# Patient Record
Sex: Female | Born: 1990 | Race: White | Hispanic: No | Marital: Single | Smoking: Former smoker
Health system: Southern US, Community
[De-identification: ages and names within clinical notes are randomized; demographics above are authoritative.]

## PROBLEM LIST (undated history)

## (undated) DIAGNOSIS — B078 Other viral warts: Secondary | ICD-10-CM

## (undated) DIAGNOSIS — K219 Gastro-esophageal reflux disease without esophagitis: Secondary | ICD-10-CM

## (undated) DIAGNOSIS — Z87898 Personal history of other specified conditions: Secondary | ICD-10-CM

## (undated) HISTORY — DX: Gastro-esophageal reflux disease without esophagitis: K21.9

## (undated) HISTORY — DX: Other viral warts: B07.8

## (undated) HISTORY — DX: Personal history of other specified conditions: Z87.898

## (undated) HISTORY — PX: FOOT SURGERY: SHX648

---
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
## 2010-09-17 HISTORY — PX: WISDOM TOOTH EXTRACTION: SHX21

## 2010-09-25 ENCOUNTER — Other Ambulatory Visit
Admission: RE | Admit: 2010-09-25 | Discharge: 2010-09-25 | Payer: Self-pay | Source: Home / Self Care | Admitting: Obstetrics and Gynecology

## 2012-05-20 ENCOUNTER — Encounter (HOSPITAL_BASED_OUTPATIENT_CLINIC_OR_DEPARTMENT_OTHER): Payer: Self-pay | Admitting: *Deleted

## 2012-05-20 ENCOUNTER — Emergency Department (HOSPITAL_BASED_OUTPATIENT_CLINIC_OR_DEPARTMENT_OTHER)
Admission: EM | Admit: 2012-05-20 | Discharge: 2012-05-20 | Disposition: A | Discharge status: Home / Self Care | Payer: BC Managed Care – PPO | Attending: Emergency Medicine | Admitting: Emergency Medicine

## 2012-05-20 DIAGNOSIS — S0990XA Unspecified injury of head, initial encounter: Secondary | ICD-10-CM | POA: Insufficient documentation

## 2012-05-20 DIAGNOSIS — S060X9A Concussion with loss of consciousness of unspecified duration, initial encounter: Secondary | ICD-10-CM

## 2012-05-20 DIAGNOSIS — IMO0002 Reserved for concepts with insufficient information to code with codable children: Secondary | ICD-10-CM

## 2012-05-20 DIAGNOSIS — Y9351 Activity, roller skating (inline) and skateboarding: Secondary | ICD-10-CM | POA: Insufficient documentation

## 2012-05-20 DIAGNOSIS — S139XXA Sprain of joints and ligaments of unspecified parts of neck, initial encounter: Secondary | ICD-10-CM | POA: Insufficient documentation

## 2012-05-20 DIAGNOSIS — S060XAA Concussion with loss of consciousness status unknown, initial encounter: Secondary | ICD-10-CM | POA: Insufficient documentation

## 2012-05-20 MED ORDER — ONDANSETRON HCL 8 MG PO TABS
4.0000 mg | ORAL_TABLET | Freq: Once | ORAL | Status: AC
  Administered 2012-05-20: 4 mg via ORAL
  Filled 2012-05-20: qty 1

## 2012-05-20 MED ORDER — HYDROCODONE-ACETAMINOPHEN 5-325 MG PO TABS: 1.0000 | ORAL_TABLET | ORAL | Status: AC | PRN

## 2012-05-20 MED ORDER — HYDROCODONE-ACETAMINOPHEN 5-325 MG PO TABS
2.0000 | ORAL_TABLET | Freq: Once | ORAL | Status: AC
  Administered 2012-05-20: 2 via ORAL
  Filled 2012-05-20: qty 2

## 2012-05-20 MED ORDER — ONDANSETRON HCL 4 MG PO TABS: 4.0000 mg | ORAL_TABLET | Freq: Four times a day (QID) | ORAL | Status: AC

## 2012-05-20 NOTE — ED Notes (Signed)
MD at bedside. 

## 2012-05-20 NOTE — ED Notes (Signed)
Pt was on a longboard 2 days ago and fell off hitting her head against asphalt. Denies LOC. Generalized pain. C/o lateral neck pains and upper back pains. + nausea but denies nausea. C/o "tunnel vision"

## 2012-05-21 NOTE — ED Provider Notes (Signed)
History     CSN: [id number]  Arrival date & time 05/20/12  Jane Nichols   First MD Initiated Contact with Patient 05/20/12 2214      Chief Complaint  Patient presents with  . Head Injury    (Consider location/radiation/quality/duration/timing/severity/associated sxs/prior treatment) HPI Jane Nichols is a 21 y.o. female who was riding a longboard skateboard two days ago and fell backward, first hitting her buttocks, then upper back, then occiput on asphalt. Denies LOC, immediate vomiting.  PT was seen by PCP and referred to ER.  PCP obtained cervical XR.  PT comlpains of bilateral cervical paraspinous muscle soreness and trapezius muscle soreness.  Pain is moderate, well localized, worse when moving her neck or shoulder girdle, she has not taken any pain medicine today, received some relief yesterday with pain relievers.  Pt is also having a dull headache for last two days which is intermittent gradual, now mild, not associated with photophobia, no vomiting, paresthesias, weakness.  She also describes difficulty concentrating, lack of focus/attention, irritability.   History reviewed. No pertinent past medical history.  Past Surgical History  Procedure Date  . Foot surgery     No family history on file.  History  Substance Use Topics  . Smoking status: Never Smoker   . Smokeless tobacco: Not on file  . Alcohol Use: No    OB History    Grav Para Term Preterm Abortions TAB SAB Ect Mult Living                  Review of Systems Positive for headache, poor concentration, nausea, neck pain, sleep disturbance, transient tunnel vision; Patient denies any fevers or chills, earache, sore throat, chest pain or pressure, palpitations, syncope, dyspnea, cough, wheezing,  abdominal pain, vomiting, diarrhea, melena, red bloody stools, frequency, dysuria, arthralgias, rash, itching, skin lesions, easy bruising or bleeding, seizures, numbness, tingling or weakness.    Allergies  Review of  patient's allergies indicates no known allergies.  Home Medications   Current Outpatient Rx  Name Route Sig Dispense Refill  . GOODY HEADACHE PO Oral Take 1 packet by mouth once as needed. For headache    . HYDROCODONE-ACETAMINOPHEN 5-325 MG PO TABS Oral Take 1-2 tablets by mouth every 4 (four) hours as needed for pain. 10 tablet 0  . ONDANSETRON HCL 4 MG PO TABS Oral Take 1 tablet (4 mg total) by mouth every 6 (six) hours. 12 tablet 0    BP 124/81  Pulse 91  Temp 98.6 F (37 C) (Oral)  Resp 20  Ht 5\' 3"  (1.6 m)  Wt 150 lb (68.04 kg)  BMI 26.57 kg/m2  SpO2 100%  LMP 05/20/2012  Physical Exam PHYSICAL EXAM: VITAL SIGNS:   Filed Vitals:   05/20/12 2154  BP: 124/81  Pulse: 91  Temp:   Resp: 20   CONSTITUTIONAL: Awake, oriented, appears non-toxic HENT: Atraumatic, normocephalic, oral mucosa pink and moist, airway patent. Nares patent without drainage. External ears normal. EYES: Conjunctiva clear, EOMI, PERRLA NECK: Trachea midline, non-tender, supple CARDIOVASCULAR: Normal heart rate, Normal rhythm, No murmurs, rubs, gallops PULMONARY/CHEST: Clear to auscultation, no rhonchi, wheezes, or rales. Symmetrical breath sounds. CHEST WALL: No lesions. Non-tender. ABDOMINAL: Non-distended, soft, non-tender - no rebound or guarding.  BS normal. NEUROLOGIC: WU:[id number] fields intact. PERRLA, EOMI.  Facial sensation equal to light touch bilaterally.  Good muscle bulk in the masseter muscle and good lateral movement of the jaw.  Facial expressions equal and good strength with smile/frown and puffed cheeks.  Hearing grossly intact to finger rub test.  Uvula, tongue are midline with no deviation. Symmetrical palate elevation.  Trapezius and SCM muscles are 5/5 strength bilaterally.   DTR: Brachioradialis, biceps, patellar, Achilles tendon reflexes 2+ bilaterally.  No clonus. Strength: 5/5 strength flexors and extensors in the upper and lower extremities.  Grip strength, finger  adduction/abduction 5/5. Sensation: Sensation intact distally to light touch Cerebellar: No ataxia with walking or dysmetria with finger to nose, rapid alternating hand movements and heels to shin testing. Gait and Station: Normal heel/toe, and tandem gait.  Negative Romberg, no pronator drift EXTREMITIES: No clubbing, cyanosis, or edema SKIN: Warm, Dry, No erythema, No rash  ED Course  Procedures (including critical care time)  Labs Reviewed - No data to display No results found.   1. Concussion   2. Head injury   3. Acute sprain or strain of cervical region     MDM  Jane Nichols is a 21 y.o. female presents 2 days after head injury with concussion.  She has no focal neurologic deficits.  Headaches and history not c/w hemorrhage, no prior medical history of brain lesions.  Neck is clinically clear.  Pt fell hitting two parts of her body before her head and presents 2 days after injury without focal deficit but symptoms consistent with concussion.  I discussed risks and benefits of CT of head - and told patient and mother there is a very small chance of missing serious pathology with her current presentation and the radiation risk was likely not worth any diagnostic data as stated.  Transient vision changes are consistent with attention lapses, no vision problems reported on exam.  No suggestion of vision loss that is persistent c/w vessel damage, retinal damage or visual cortex damage.  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  Also, she is currently taking 5 classes, urged to space study periods, keep good sleep habits and monitor her headaches, moods and cognition carefully.  She and mom understand to return to the ER for any concerning changes.  Pt educated on length of symptoms may be days to months.    I explained the diagnosis and have given explicit precautions to return to the ER including change in  behavior, change in vision, worsening headache, vomiting or any other new or worsening symptoms. The patient understands and accepts the medical plan as it's been dictated and I have answered their questions. Discharge instructions concerning home care and prescriptions have been given.  The patient is STABLE and is discharged to home in good condition.          Jones Skene, MD 05/21/12 1324

## 2013-04-23 ENCOUNTER — Other Ambulatory Visit: Payer: Self-pay | Admitting: Nurse Practitioner

## 2013-04-23 ENCOUNTER — Other Ambulatory Visit (HOSPITAL_COMMUNITY)
Admission: RE | Admit: 2013-04-23 | Discharge: 2013-04-23 | Disposition: A | Discharge status: Home / Self Care | Payer: BC Managed Care – PPO | Source: Ambulatory Visit | Attending: Obstetrics and Gynecology | Admitting: Obstetrics and Gynecology

## 2013-04-23 DIAGNOSIS — Z01419 Encounter for gynecological examination (general) (routine) without abnormal findings: Secondary | ICD-10-CM | POA: Insufficient documentation

## 2013-04-23 DIAGNOSIS — Z113 Encounter for screening for infections with a predominantly sexual mode of transmission: Secondary | ICD-10-CM | POA: Insufficient documentation

## 2019-08-10 ENCOUNTER — Other Ambulatory Visit: Payer: Self-pay

## 2019-08-10 ENCOUNTER — Ambulatory Visit (INDEPENDENT_AMBULATORY_CARE_PROVIDER_SITE_OTHER): Admitting: Family Medicine

## 2019-08-10 ENCOUNTER — Encounter: Payer: Self-pay | Admitting: Family Medicine

## 2019-08-10 VITALS — BP 136/82 | HR 97 | Temp 98.3°F | Resp 14 | Ht 64.0 in | Wt 147.1 lb

## 2019-08-10 DIAGNOSIS — Z0000 Encounter for general adult medical examination without abnormal findings: Secondary | ICD-10-CM

## 2019-08-10 DIAGNOSIS — L989 Disorder of the skin and subcutaneous tissue, unspecified: Secondary | ICD-10-CM

## 2019-08-10 DIAGNOSIS — Z7689 Persons encountering health services in other specified circumstances: Secondary | ICD-10-CM | POA: Diagnosis not present

## 2019-08-10 DIAGNOSIS — Z0100 Encounter for examination of eyes and vision without abnormal findings: Secondary | ICD-10-CM

## 2019-08-10 DIAGNOSIS — Z021 Encounter for pre-employment examination: Secondary | ICD-10-CM | POA: Diagnosis not present

## 2019-08-10 DIAGNOSIS — Z1329 Encounter for screening for other suspected endocrine disorder: Secondary | ICD-10-CM | POA: Diagnosis not present

## 2019-08-10 DIAGNOSIS — Z0110 Encounter for examination of ears and hearing without abnormal findings: Secondary | ICD-10-CM

## 2019-08-10 DIAGNOSIS — Z13220 Encounter for screening for lipoid disorders: Secondary | ICD-10-CM

## 2019-08-10 DIAGNOSIS — Z111 Encounter for screening for respiratory tuberculosis: Secondary | ICD-10-CM

## 2019-08-10 DIAGNOSIS — Z23 Encounter for immunization: Secondary | ICD-10-CM | POA: Diagnosis not present

## 2019-08-10 DIAGNOSIS — Z130 Encounter for screening for diseases of the blood and blood-forming organs and certain disorders involving the immune mechanism: Secondary | ICD-10-CM

## 2019-08-10 DIAGNOSIS — Z13228 Encounter for screening for other metabolic disorders: Secondary | ICD-10-CM

## 2019-08-10 LAB — POCT URINALYSIS DIPSTICK
Bilirubin, UA: NEGATIVE
Blood, UA: NEGATIVE
Glucose, UA: NEGATIVE
Ketones, UA: NEGATIVE
Leukocytes, UA: NEGATIVE
Nitrite, UA: NEGATIVE
Odor: NORMAL
Protein, UA: NEGATIVE
Spec Grav, UA: 1.02 (ref 1.010–1.025)
Urobilinogen, UA: 0.2 E.U./dL
pH, UA: 5.5 (ref 5.0–8.0)

## 2019-08-10 NOTE — Progress Notes (Signed)
Patient: Jane Nichols, Female    DOB: 02-19-1991, 28 y.o.   MRN: [id number] Delsa Grana, PA-C Visit Date: 08/12/2019  Today's Provider: Delsa Grana, PA-C   Chief Complaint  Patient presents with  . Establish Care  . Referrals    [facility], derm: warts and skin check  . Form Completion    basic law enforcement training   Subjective:   Annual physical exam:  Jane Nichols is a 28 y.o. 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    Hearing and vision screening done today  Diet/nutrition: Generally really healthy diet  Sleep: Sleeps well  Patient does need multiple forms filled out for a educational program with local sheriff's department and [facility] state basically preemployment [place] education clearance for physical testing also includes vision, hearing, transmittable diseases, cardiovascular exam muscle skeletal exam etc.   Advised pt of separate visit billing/coding  USPSTF grade A and B recommendations - reviewed and addressed today  Depression:  Phq 9 completed today by patient, was reviewed by me with patient in the room, score is  negative, pt feels good PHQ 2/9 Scores 08/10/2019  PHQ - 2 Score 0  PHQ- 9 Score 0   Depression screen PHQ 2/9 08/10/2019  Decreased Interest 0  Down, Depressed, Hopeless 0  PHQ - 2 Score 0  Altered sleeping 0  Tired, decreased energy 0  Change in appetite 0  Feeling bad or failure about yourself  0  Trouble concentrating 0   Moving slowly or fidgety/restless 0  Suicidal thoughts 0  PHQ-9 Score 0  Difficult doing work/chores Not difficult at all    Alcohol screening:   Office Visit from 08/10/2019 in [facility]  AUDIT-C Score  2      Immunizations and Health Maintenance: Health Maintenance  Topic Date Due  . PAP-Cervical Cytology Screening  07/08/2020  . PAP SMEAR-Modifier  08/08/2020  . TETANUS/TDAP  08/09/2029  . INFLUENZA VACCINE  Completed  . HIV Screening  Completed     Hep C Screening: not indicated  STD testing and prevention (HIV/chl/gon/syphilis): HIV done in the past, last PAP and GYN exam 2018  Intimate partner violence:feels safe at home and in her relationship  Sexual History/Pain during Intercourse:  neg Significant Other  Menstrual History/LMP/Abnormal Bleeding: normal menses Patient's last menstrual period was 07/10/2019.  Incontinence Symptoms: none  Breast cancer:  Last Mammogram: 2018 - benign BRCA gene screening: none  Cervical cancer screening: 2018 Family hx of cancers - breast, ovarian, uterine, colon:  none  Osteoporosis:   Discussed high calcium and vitamin D supplementation, weight bearing exercises  Skin cancer:  Hx of skin CA -  NO Discussed atypical lesions - she does have some skin lesions to right hand - appear similar to warts - she would like referral to dermatology  Colorectal cancer:   colonoscopy is not indicated per age    Lung cancer:   Low Dose CT Chest recommended if Age 1-80  years, 30 pack-year currently smoking OR have quit w/in 15years. Patient does not qualify.   Social History   Tobacco Use  . Smoking status: Former Smoker    Packs/day: 0.50    Years: 4.00    Pack years: 2.00  . Smokeless tobacco: Never Used  Substance Use Topics  . Alcohol use: Yes    Alcohol/week: 1.0 standard drinks    Types: 1 Cans of beer per week     ECG: not indicated  Blood pressure/Hypertension: BP Readings from Last 3  Encounters:  08/10/19 136/82  05/20/12 124/81    Weight/Obesity: Wt Readings from Last 3 Encounters:  08/10/19 147 lb 1.6 oz (66.7 kg)  05/20/12 150 lb (68 kg)   BMI Readings from Last 3 Encounters:  08/10/19 25.25 kg/m  05/20/12 26.57 kg/m     Lipids:  No results found for: CHOL No results found for: HDL No results found for: LDLCALC No results found for: TRIG No results found for: CHOLHDL No results found for: LDLDIRECT Based on the results of lipid panel his/her cardiovascular risk factor ( using [place] )  in the next 10 years is: The ASCVD Risk score Mikey Bussing DC Jr., et al., 2013) failed to calculate for the following reasons:   The 2013 ASCVD risk score is only valid for ages 49 to 31  Glucose:  No results found for: GLUCOSE, [place]    Office Visit from 08/10/2019 in [facility]  AUDIT-C Score  2     Depression: Phq 9 is  negative Depression screen [facility] 2/9 08/10/2019  Decreased Interest 0  Down, Depressed, Hopeless 0  PHQ - 2 Score 0  Altered sleeping 0  Tired, decreased energy 0  Change in appetite 0  Feeling bad or failure about yourself  0  Trouble concentrating 0  Moving slowly or fidgety/restless 0  Suicidal thoughts 0  PHQ-9 Score 0  Difficult doing work/chores Not difficult at all   Hypertension: BP Readings from Last 3 Encounters:  08/10/19 136/82  05/20/12 124/81   Obesity: Wt Readings from Last 3 Encounters:  08/10/19 147 lb 1.6 oz (66.7 kg)  05/20/12 150 lb (68 kg)   BMI Readings from Last 3 Encounters:  08/10/19 25.25 kg/m  05/20/12 26.57 kg/m     Social History      She  reports that she has quit smoking. She has a 2.00 pack-year smoking history. She has never used smokeless tobacco. She reports current alcohol use of about 1.0 standard drinks of alcohol per week. She reports that she does not use drugs.       Social History   Socioeconomic History  . Marital status: Significant Other    Spouse name:  Not on file  . Number of children: Not on file  . Years of education: 15  . Highest education level: Bachelor's degree (e.g., BA, AB, BS)  Occupational History  . Not on file  Social Needs  . Financial resource strain: Not hard at all  . Food insecurity    Worry: Never true    Inability: Never true  . Transportation needs    Medical: No    Non-medical: No  Tobacco Use  . Smoking status: Former Smoker    Packs/day: 0.50    Years: 4.00    Pack years: 2.00  . Smokeless tobacco: Never Used  Substance and Sexual Activity  . Alcohol use: Yes    Alcohol/week: 1.0 standard drinks    Types: 1 Cans of  beer per week  . Drug use: No  . Sexual activity: Yes    Birth control/protection: None    Comment: female partner  Lifestyle  . Physical activity    Days per week: 4 days    Minutes per session: 60 min  . Stress: To some extent  Relationships  . Social Herbalist on phone: Twice a week    Gets together: Twice a week    Attends religious service: More than 4 times per year    Active member of club or organization: No    Attends meetings of clubs or organizations: Never    Relationship status: Not on file  Other Topics Concern  . Not on file  Social History Narrative  . Not on file    Family History        Family Status  Relation Name Status  . Mother  Alive  . Father  Alive  . Sister 1 Alive  . Brother 1 Alive  . MGM  Alive  . MGF  Alive  . PGM  Alive  . PGF  Alive        Her family history includes Bipolar disorder in her mother; Diabetes in her paternal grandfather.       Family History  Problem Relation Age of Onset  . Bipolar disorder Mother   . Diabetes Paternal Grandfather     There are no active problems to display for this patient.   Past Surgical History:  Procedure Laterality Date  . FOOT SURGERY    . TOE SURGERY Right 2006   bone spur  . WISDOM TOOTH EXTRACTION  2012     Current Outpatient Medications:  .   Aspirin-Acetaminophen-Caffeine (GOODY HEADACHE PO), Take 1 packet by mouth once as needed. For headache, Disp: , Rfl:   No Known Allergies  Patient Care Team: Delsa Grana, PA-C as PCP - General (Family Medicine)  Review of Systems  Constitutional: Negative.  Negative for activity change, appetite change, fatigue and unexpected weight change.  HENT: Negative.   Eyes: Negative.   Respiratory: Negative.  Negative for shortness of breath.   Cardiovascular: Negative.  Negative for chest pain, palpitations and leg swelling.  Gastrointestinal: Negative.  Negative for abdominal pain and blood in stool.  Endocrine: Negative.   Genitourinary: Negative.   Musculoskeletal: Negative.  Negative for arthralgias, gait problem, joint swelling and myalgias.  Skin: Negative.  Negative for color change, pallor and rash.  Allergic/Immunologic: Negative.   Neurological: Negative.  Negative for syncope and weakness.  Hematological: Negative.   Psychiatric/Behavioral: Negative.  Negative for confusion, dysphoric mood, self-injury and suicidal ideas. The patient is not nervous/anxious.             Objective:   Vitals:  Vitals:   08/10/19 1455  BP: 136/82  Pulse: 97  Resp: 14  Temp: 98.3 F (36.8 C)  SpO2: 100%  Weight: 147 lb 1.6 oz (66.7 kg)  Height: 5' 4"  (1.626 m)    Body mass index is 25.25 kg/m.  Physical Exam Vitals signs and nursing note reviewed.  Constitutional:      General: She is not in acute distress.    Appearance: Normal appearance. She is well-developed and normal weight. She is not ill-appearing, toxic-appearing or diaphoretic.  HENT:     Head: Normocephalic and atraumatic.     Right Ear: Tympanic membrane, ear canal and external ear normal. There is no impacted cerumen.     Left Ear: Tympanic membrane,  ear canal and external ear normal. There is no impacted cerumen.     Nose: Nose normal. No congestion or rhinorrhea.     Mouth/Throat:     Mouth: Mucous membranes are  moist.     Pharynx: Oropharynx is clear. Uvula midline.  Eyes:     General: Lids are normal. No scleral icterus.       Right eye: No discharge.        Left eye: No discharge.     Conjunctiva/sclera: Conjunctivae normal.     Pupils: Pupils are equal, round, and reactive to light.  Neck:     Musculoskeletal: Normal range of motion and neck supple.     Trachea: Phonation normal. No tracheal deviation.  Cardiovascular:     Rate and Rhythm: Normal rate and regular rhythm.     Pulses: Normal pulses.          Radial pulses are 2+ on the right side and 2+ on the left side.       Posterior tibial pulses are 2+ on the right side and 2+ on the left side.     Heart sounds: Normal heart sounds. No murmur. No friction rub. No gallop.   Pulmonary:     Effort: Pulmonary effort is normal. No respiratory distress.     Breath sounds: Normal breath sounds. No stridor. No wheezing, rhonchi or rales.  Chest:     Chest wall: No tenderness.  Abdominal:     General: Bowel sounds are normal. There is no distension.     Palpations: Abdomen is soft.     Tenderness: There is no abdominal tenderness. There is no guarding or rebound.  Musculoskeletal: Normal range of motion.        General: No deformity.  Lymphadenopathy:     Cervical: No cervical adenopathy.  Skin:    General: Skin is warm and dry.     Capillary Refill: Capillary refill takes less than 2 seconds.     Coloration: Skin is not jaundiced or pale.     Findings: Lesion present. No bruising or rash.     Comments: Raised flesh-colored papules to right finger roughly 7 mm in diameter  Neurological:     Mental Status: She is alert and oriented to person, place, and time.     Motor: No weakness or abnormal muscle tone.     Coordination: Coordination normal.     Gait: Gait normal.  Psychiatric:        Mood and Affect: Mood normal.        Speech: Speech normal.        Behavior: Behavior normal.    Vision and hearing screening normal, reviewed,  see EMR   Fall Risk: Fall Risk  08/10/2019  Falls in the past year? 0  Number falls in past yr: 0  Injury with Fall? 0    Functional Status Survey: Is the patient deaf or have difficulty hearing?: No Does the patient have difficulty seeing, even when wearing glasses/contacts?: No Does the patient have difficulty concentrating, remembering, or making decisions?: No Does the patient have difficulty walking or climbing stairs?: No Does the patient have difficulty dressing or bathing?: No Does the patient have difficulty doing errands alone such as visiting a doctor's office or shopping?: No   Assessment & Plan:    CPE completed today  . USPSTF grade A and B recommendations reviewed with patient; age-appropriate recommendations, preventive care, screening tests, etc discussed and encouraged; healthy living encouraged; see AVS  for patient education given to patient  . Discussed importance of 150 minutes of physical activity weekly, AHA exercise recommendations given to pt in AVS/handout  . Discussed importance of healthy diet:  eating lean meats and proteins, avoiding trans fats and saturated fats, avoid simple sugars and excessive carbs in diet, eat 6 servings of fruit/vegetables daily and drink plenty of water and avoid sweet beverages.    . Recommended pt to do annual eye exam and routine dental exams/cleanings  . Depression, alcohol, fall screening completed as documented above and per flowsheets  . Reviewed Health Maintenance: Health Maintenance  Topic Date Due  . PAP-Cervical Cytology Screening  07/08/2020  . PAP SMEAR-Modifier  08/08/2020  . TETANUS/TDAP  08/09/2029  . INFLUENZA VACCINE  Completed  . HIV Screening  Completed    . Immunizations:  Pt is requesting records from [employer] and will bring in Immunization History  Administered Date(s) Administered  . Tdap 08/10/2019     ICD-10-CM   1. Adult general medical exam  Z00.00 POCT UA dip    CBC w/ Diff    CMP w  GFR    Lipid Panel    A1C    TSH    Hearing screening    Visual acuity screening   completed today  2. Encounter to establish care with new doctor  Z76.89   3. Screening for endocrine, metabolic and immunity disorder  Z13.29 CBC w/ Diff   Z13.228 CMP w GFR   Z13.0 Lipid Panel    A1C    TSH  4. Screening for lipoid disorders  Z13.220 CMP w GFR    Lipid Panel  5. Screening for deficiency anemia  Z13.0 CBC w/ Diff  6. Pre-employment health screening examination  Z02.1 POCT UA dip    CBC w/ Diff    CMP w GFR    Lipid Panel    A1C    TSH    QuantiFERON-TB Gold Plus    Hearing screening    Visual acuity screening   multiple forms completed today, copy obtained and will be scanned into the chart  7. Screening-pulmonary TB  Z11.1 QuantiFERON-TB Gold Plus   Tested last year with [employer] but has traveled internationally and returned last month  8. Skin lesion of hand  L98.9 Ambulatory referral to Dermatology   looks like warts/molluscum?  refer to derm - pt has other concerns that she wants evaluated, but she did not disclose - said "she's have them look at other stuf  9. Need for Tdap vaccination  Z23 Tdap vaccine greater than or equal to 7yo IM  10. Encounter for vision screening without abnormal findings  Z01.00   11. Passed hearing screening  Z01.10    UA normal - needed for forms   Orders Placed This Encounter  Procedures  . Tdap vaccine greater than or equal to 7yo IM  . CBC w/ Diff  . CMP w GFR  . Lipid Panel    Order Specific Question:   Has the patient fasted?    Answer:   Yes  . A1C  . TSH  . QuantiFERON-TB Gold Plus  . Ambulatory referral to Dermatology    Referral Priority:   Routine    Referral Type:   Consultation    Referral Reason:   Specialty Services Required    Requested Specialty:   Dermatology    Number of Visits Requested:   1  . Visual acuity screening  . POCT UA dip  . Hearing screening  Standing Status:   Future    Standing Expiration Date:    08/09/2020    Order Specific Question:   Where should this test be performed?    Answer:   Other        Delsa Grana, PA-C 08/12/19 1:58 AM  [facility]

## 2019-08-10 NOTE — Progress Notes (Deleted)
Name: [name]   MRN: [id number]    DOB: 09/26/1990   Date:08/10/2019       Progress Note  Chief Complaint  Patient presents with  . Establish Care  . Referrals    [id number], derm: warts and skin check  . Form Completion    basic law enforcement training     Subjective:   Jane Nichols is a 28 y.o. female, presents to clinic for routine follow up on the conditions listed above.  Pt new to establish care She has served in [employer], gotten care at [facility] health screening Last PAP is estimated to be about 3 years ago.  She brings in a packet for criminal justice education and training  [employer] [employer]  Program Jan 11- June 12th, needs physical form by tomorrow.    Hearing and vision screening done today     There are no active problems to display for this patient.   Past Surgical History:  Procedure Laterality Date  . FOOT SURGERY    . TOE SURGERY Right 2006   bone spur  . WISDOM TOOTH EXTRACTION  2012    Family History  Problem Relation Age of Onset  . Bipolar disorder Mother   . Diabetes Paternal Grandfather     Social History   Socioeconomic History  . Marital status: Single    Spouse name: Not on file  . Number of children: Not on file  . Years of education: 74  . Highest education level: Bachelor's degree (e.g., BA, AB, BS)  Occupational History  . Not on file  Social Needs  . Financial resource strain: Not hard at all  . Food insecurity    Worry: Never true    Inability: Never true  . Transportation needs    Medical: No    Non-medical: No  Tobacco Use  . Smoking status: Former Research scientist (life sciences)  . Smokeless tobacco: Never Used  Substance and Sexual Activity  . Alcohol use: Yes  . Drug use: No  . Sexual activity: Yes    Birth control/protection: None  Lifestyle  . Physical activity    Days per week: 4 days    Minutes per session: 60 min  . Stress: To some extent  Relationships  . Social Herbalist on  phone: Twice a week    Gets together: Twice a week    Attends religious service: More than 4 times per year    Active member of club or organization: No    Attends meetings of clubs or organizations: Never    Relationship status: Not on file  . Intimate partner violence    Fear of current or ex partner: No    Emotionally abused: No    Physically abused: No    Forced sexual activity: Not on file  Other Topics Concern  . Not on file  Social History Narrative  . Not on file     Current Outpatient Medications:  .  Aspirin-Acetaminophen-Caffeine (GOODY HEADACHE PO), Take 1 packet by mouth once as needed. For headache, Disp: , Rfl:   No Known Allergies  I personally reviewed {Reviewed:[id number]} with the patient/caregiver today.  Review of Systems   Objective:    Vitals:   08/10/19 1455  BP: 136/82  Pulse: 97  Resp: 14  Temp: 98.3 F (36.8 C)  SpO2: 100%  Weight: 147 lb 1.6 oz (66.7 kg)  Height: 5\' 4"  (1.626 m)    Body mass index is 25.25  kg/m.  Physical Exam   No results found for this or any previous visit (from the past 2160 hour(s)).  Diabetic Foot Exam: Diabetic Foot Exam - Simple   No data filed       PHQ2/9: Depression screen [facility] 2/9 08/10/2019  Decreased Interest 0  Down, Depressed, Hopeless 0  PHQ - 2 Score 0  Altered sleeping 0  Tired, decreased energy 0  Change in appetite 0  Feeling bad or failure about yourself  0  Trouble concentrating 0  Moving slowly or fidgety/restless 0  Suicidal thoughts 0  PHQ-9 Score 0  Difficult doing work/chores Not difficult at all    phq 9 is {gen pos [id number] ***  Fall Risk: Fall Risk  08/10/2019  Falls in the past year? 0  Number falls in past yr: 0  Injury with Fall? 0      Functional Status Survey: Is the patient deaf or have difficulty hearing?: No Does the patient have difficulty seeing, even when wearing glasses/contacts?: No Does the patient have difficulty concentrating, remembering, or  making decisions?: No Does the patient have difficulty walking or climbing stairs?: No Does the patient have difficulty dressing or bathing?: No Does the patient have difficulty doing errands alone such as visiting a doctor's office or shopping?: No    Assessment & Plan:   Problem List Items Addressed This Visit    None       No follow-ups on file.   Delsa Grana, PA-C 08/10/19 3:24 PM

## 2019-08-10 NOTE — Patient Instructions (Signed)
Preventive Care 21-28 Years Old, Female Preventive care refers to visits with your health care provider and lifestyle choices that can promote health and wellness. This includes:  A yearly physical exam. This may also be called an annual well check.  Regular dental visits and eye exams.  Immunizations.  Screening for certain conditions.  Healthy lifestyle choices, such as eating a healthy diet, getting regular exercise, not using drugs or products that contain nicotine and tobacco, and limiting alcohol use. What can I expect for my preventive care visit? Physical exam Your health care provider will check your:  Height and weight. This may be used to calculate body mass index (BMI), which tells if you are at a healthy weight.  Heart rate and blood pressure.  Skin for abnormal spots. Counseling Your health care provider may ask you questions about your:  Alcohol, tobacco, and drug use.  Emotional well-being.  Home and relationship well-being.  Sexual activity.  Eating habits.  Work and work environment.  Method of birth control.  Menstrual cycle.  Pregnancy history. What immunizations do I need?  Influenza (flu) vaccine  This is recommended every year. Tetanus, diphtheria, and pertussis (Tdap) vaccine  You may need a Td booster every 10 years. Varicella (chickenpox) vaccine  You may need this if you have not been vaccinated. Human papillomavirus (HPV) vaccine  If recommended by your health care provider, you may need three doses over 6 months. Measles, mumps, and rubella (MMR) vaccine  You may need at least one dose of MMR. You may also need a second dose. Meningococcal conjugate (MenACWY) vaccine  One dose is recommended if you are age 19-21 years and a first-year college student living in a residence hall, or if you have one of several medical conditions. You may also need additional booster doses. Pneumococcal conjugate (PCV13) vaccine  You may need  this if you have certain conditions and were not previously vaccinated. Pneumococcal polysaccharide (PPSV23) vaccine  You may need one or two doses if you smoke cigarettes or if you have certain conditions. Hepatitis A vaccine  You may need this if you have certain conditions or if you travel or work in places where you may be exposed to hepatitis A. Hepatitis B vaccine  You may need this if you have certain conditions or if you travel or work in places where you may be exposed to hepatitis B. Haemophilus influenzae type b (Hib) vaccine  You may need this if you have certain conditions. You may receive vaccines as individual doses or as more than one vaccine together in one shot (combination vaccines). Talk with your health care provider about the risks and benefits of combination vaccines. What tests do I need?  Blood tests  Lipid and cholesterol levels. These may be checked every 5 years starting at age 20.  Hepatitis C test.  Hepatitis B test. Screening  Diabetes screening. This is done by checking your blood sugar (glucose) after you have not eaten for a while (fasting).  Sexually transmitted disease (STD) testing.  BRCA-related cancer screening. This may be done if you have a family history of breast, ovarian, tubal, or peritoneal cancers.  Pelvic exam and Pap test. This may be done every 3 years starting at age 21. Starting at age 30, this may be done every 5 years if you have a Pap test in combination with an HPV test. Talk with your health care provider about your test results, treatment options, and if necessary, the need for more tests.   Follow these instructions at home: Eating and drinking   Eat a diet that includes fresh fruits and vegetables, whole grains, lean protein, and low-fat dairy.  Take vitamin and mineral supplements as recommended by your health care provider.  Do not drink alcohol if: ? Your health care provider tells you not to drink. ? You are  pregnant, may be pregnant, or are planning to become pregnant.  If you drink alcohol: ? Limit how much you have to 0-1 drink a day. ? Be aware of how much alcohol is in your drink. In the [place] one drink equals one 12 oz bottle of beer (355 mL), one 5 oz glass of wine (148 mL), or one 1 oz glass of hard liquor (44 mL). Lifestyle  Take daily care of your teeth and gums.  Stay active. Exercise for at least 30 minutes on 5 or more days each week.  Do not use any products that contain nicotine or tobacco, such as cigarettes, e-cigarettes, and chewing tobacco. If you need help quitting, ask your health care provider.  If you are sexually active, practice safe sex. Use a condom or other form of birth control (contraception) in order to prevent pregnancy and STIs (sexually transmitted infections). If you plan to become pregnant, see your health care provider for a preconception visit. What's next?  Visit your health care provider once a year for a well check visit.  Ask your health care provider how often you should have your eyes and teeth checked.  Stay up to date on all vaccines. This information is not intended to replace advice given to you by your health care provider. Make sure you discuss any questions you have with your health care provider. Document Released: 10/30/2001 Document Revised: 05/15/2018 Document Reviewed: 05/15/2018 Elsevier Patient Education  2020 Elsevier Inc.  

## 2019-08-12 ENCOUNTER — Encounter: Payer: Self-pay | Admitting: Family Medicine

## 2020-02-23 ENCOUNTER — Other Ambulatory Visit: Payer: Self-pay

## 2020-02-23 ENCOUNTER — Ambulatory Visit: Admitting: Family Medicine

## 2020-03-03 ENCOUNTER — Other Ambulatory Visit: Payer: Self-pay

## 2020-03-03 ENCOUNTER — Ambulatory Visit
Admission: RE | Admit: 2020-03-03 | Discharge: 2020-03-03 | Disposition: A | Discharge status: Home / Self Care | Attending: Family Medicine | Admitting: Family Medicine

## 2020-03-03 ENCOUNTER — Ambulatory Visit
Admission: RE | Admit: 2020-03-03 | Discharge: 2020-03-03 | Disposition: A | Discharge status: Home / Self Care | Source: Ambulatory Visit | Attending: Family Medicine | Admitting: Family Medicine

## 2020-03-03 ENCOUNTER — Ambulatory Visit (INDEPENDENT_AMBULATORY_CARE_PROVIDER_SITE_OTHER): Admitting: Family Medicine

## 2020-03-03 ENCOUNTER — Encounter: Payer: Self-pay | Admitting: Family Medicine

## 2020-03-03 VITALS — BP 110/70 | HR 99 | Temp 97.5°F | Resp 18 | Ht 64.0 in | Wt 143.5 lb

## 2020-03-03 DIAGNOSIS — M79641 Pain in right hand: Secondary | ICD-10-CM | POA: Insufficient documentation

## 2020-03-03 DIAGNOSIS — L819 Disorder of pigmentation, unspecified: Secondary | ICD-10-CM | POA: Diagnosis not present

## 2020-03-03 DIAGNOSIS — R140 Abdominal distension (gaseous): Secondary | ICD-10-CM

## 2020-03-03 DIAGNOSIS — R1084 Generalized abdominal pain: Secondary | ICD-10-CM

## 2020-03-03 LAB — POCT URINALYSIS DIPSTICK
Bilirubin, UA: NEGATIVE
Blood, UA: NEGATIVE
Glucose, UA: NEGATIVE
Ketones, UA: NEGATIVE
Leukocytes, UA: NEGATIVE
Nitrite, UA: NEGATIVE
Protein, UA: NEGATIVE
Spec Grav, UA: 1.02 (ref 1.010–1.025)
Urobilinogen, UA: 0.2 E.U./dL
pH, UA: 5 (ref 5.0–8.0)

## 2020-03-03 MED ORDER — PANTOPRAZOLE SODIUM 40 MG PO TBEC: 40.0000 mg | DELAYED_RELEASE_TABLET | Freq: Every day | ORAL | 3 refills | Status: DC

## 2020-03-03 MED ORDER — FAMOTIDINE 20 MG PO TABS: 20.0000 mg | ORAL_TABLET | Freq: Two times a day (BID) | ORAL | 0 refills | Status: DC | PRN

## 2020-03-03 NOTE — Progress Notes (Signed)
Patient ID: Jane Nichols, female    DOB: 06-21-1991, 29 y.o.   MRN: [id number]  PCP: Jane Grana, PA-C  Chief Complaint  Patient presents with  . Abdominal Pain    intermittent for 2 weeks  . Gastroesophageal Reflux  . Hand Pain    right hand pain smashed into something need xray    Subjective:   Jane Nichols is a 29 y.o. female, presents to clinic with CC of the following:  Abdominal Pain This is a new problem. The current episode started 1 to 4 weeks ago. The onset quality is gradual. There is no history of abdominal surgery, colon cancer, Crohn's disease, gallstones or ulcerative colitis.  Patient reports about 3 to 4 weeks ago having gradual worsening of generalized abdominal pain that was coming and going.  She felt bloated very easily after eating, she felt constipated and had cramping and bloating to her lower abdomen.  Sometimes with constipation she did have sharp pains come and go and other areas of her abdomen where she also was having nausea, indigestion, early satiety symptoms " random nausea and vomiting" after eating.  She has had some stomach problems in the past that she is just "dealt with" but is never had anything like this.  When her abdominal pain started it was extremely severe and has improved with past 2 weeks but not completely resolved.  She has been experiencing reflux for which she started Prilosec in the last week and it did help with some of her reflux indigestion and bloating but she does continue to feel full early and be nauseous with occasional vomiting..  She did take a laxative which helped her feel a little bit less constipated and bloated and she started some MiraLAX.  She reports that she is moving stool but still feels very backed up like she cannot have a good normal bowel movement.  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 She has not had any melena, hematochezia, emesis is consistent with stomach contents and food that she is eating she denies any bloody or bilious emesis she denies any menstrual cycle changes or concerns denies any vaginal discharge, has no urinary concerns and no possibility of her being pregnant she is not sexually active with men. She does admit that she could improve her diet and add fiber to her diet. She denies anorexia, arthralgias, diarrhea, dysuria, fever, medic easier, headaches, hematuria, myalgias, fever, sweats, chills, weight loss. She suspects that she might have IBS, she has not tried adjusting diet but she is not used to being bloated or having a "pooch"  Patient has not done her labs from her past physical-we will have her do those today if willing  She does ask for an x-ray of her right hand she states that she "ran into something" but denies punching anything, and notes that it was black and blue and swollen over her fourth and fifth dorsal metacarpals, no current deformity and bruising and swelling have improved but she still has some tenderness and she would like an x-ray  She also wants to follow-up with dermatology she has not yet been to dermatology although we have discussed referral previously.  She is concerned with some dark pigmentation and to her upper lip and some other spots on her face.  There are no problems to display for this patient.     Current Outpatient Medications:  .  omeprazole (PRILOSEC) 40 MG capsule, Take 40 mg by mouth daily., Disp: , Rfl:  .  Aspirin-Acetaminophen-Caffeine (GOODY HEADACHE PO), Take 1 packet by mouth once as needed. For headache (Patient not taking: Reported on 03/03/2020), Disp: , Rfl:  .  famotidine (PEPCID) 20 MG tablet, Take 1 tablet (20 mg total) by mouth 2 (two) times daily as needed for heartburn or indigestion., Disp: 30 tablet, Rfl: 0 .  pantoprazole (PROTONIX) 40 MG  tablet, Take 1 tablet (40 mg total) by mouth daily. Empty stomach, do not eat for 30 min, Disp: 30 tablet, Rfl: 3   No Known Allergies   Social History   Tobacco Use  . Smoking status: Former Smoker    Packs/day: 0.50    Years: 4.00    Pack years: 2.00  . Smokeless tobacco: Never Used  Vaping Use  . Vaping Use: Never used  Substance Use Topics  . Alcohol use: Yes    Alcohol/week: 1.0 standard drink    Types: 1 Cans of beer per week  . Drug use: No      Chart Review Today: I personally reviewed active problem list, medication list, allergies, family history, social history, health maintenance, notes from last encounter, lab results, imaging with the patient/caregiver today.    Review of Systems  Gastrointestinal: Positive for abdominal pain.  10 Systems reviewed and are negative for acute change except as noted in the HPI.      Objective:   Vitals:   03/03/20 1406  BP: 110/70  Pulse: 99  Resp: 18  Temp: (!) 97.5 F (36.4 C)  TempSrc: Temporal  SpO2: 99%  Weight: 143 lb 8 oz (65.1 kg)  Height: 5\' 4"  (1.626 m)    Body mass index is 24.63 kg/m.  Physical Exam Constitutional:      General: She is not in acute distress.    Appearance: Normal appearance. She is well-developed. She is not toxic-appearing or diaphoretic.  HENT:     Head: Normocephalic and atraumatic.     Right Ear: External ear normal.     Left Ear: External ear normal.     Nose: Nose normal.     Mouth/Throat:     Mouth: Mucous membranes are moist.     Pharynx: Oropharynx is clear. Uvula midline.  Eyes:     General: Lids are normal. No scleral icterus.    Conjunctiva/sclera: Conjunctivae normal.     Pupils: Pupils are equal, round, and reactive to light.  Neck:     Trachea: Phonation normal. No tracheal deviation.  Cardiovascular:     Rate and Rhythm: Normal rate and regular rhythm.     Pulses: Normal pulses.          Radial pulses are 2+ on the right side and 2+ on the left side.        Posterior tibial pulses are 2+ on the right side and 2+ on the left side.     Heart sounds: Normal heart sounds. No murmur heard.  No friction rub. No gallop.   Pulmonary:     Effort: Pulmonary effort is normal. No respiratory distress.     Breath sounds: Normal breath sounds. No stridor. No wheezing, rhonchi or rales.  Chest:     Chest wall: No tenderness.  Abdominal:     General: Abdomen is flat. Bowel sounds are normal. There is no distension or abdominal  bruit.     Palpations: Abdomen is soft. There is no shifting dullness, hepatomegaly, splenomegaly, mass or pulsatile mass.     Tenderness: There is abdominal tenderness (mild epigastric ttp w/o rebound or guarding). There is no guarding or rebound.     Hernia: No hernia is present.  Musculoskeletal:        General: No deformity. Normal range of motion.     Right hand: Tenderness and bony tenderness present. No swelling, deformity or lacerations. Normal range of motion.     Cervical back: Normal range of motion and neck supple.  Lymphadenopathy:     Cervical: No cervical adenopathy.  Skin:    General: Skin is warm and dry.     Capillary Refill: Capillary refill takes less than 2 seconds.     Coloration: Skin is not pale.     Findings: No rash.     Comments: Pt points to upper lip and left temple for areas of concern - no abnormality appreciated  Neurological:     Mental Status: She is alert.     Motor: No abnormal muscle tone.     Gait: Gait normal.  Psychiatric:        Attention and Perception: Attention normal.        Mood and Affect: Affect normal. Mood is anxious.        Speech: Speech normal.        Behavior: Behavior normal.      Results for orders placed or performed in visit on 08/10/19  POCT UA dip  Result Value Ref Range   Color, UA light yellow    Clarity, UA clear    Glucose, UA Negative Negative   Bilirubin, UA neg    Ketones, UA neg    Spec Grav, UA 1.020 1.010 - 1.025   Blood, UA neg    pH, UA 5.5 5.0  - 8.0   Protein, UA Negative Negative   Urobilinogen, UA 0.2 0.2 or 1.0 E.U./dL   Nitrite, UA neg    Leukocytes, UA Negative Negative   Appearance clear    Odor normal        Assessment & Plan:     ICD-10-CM   1. Postprandial abdominal bloating  R14.0 pantoprazole (PROTONIX) 40 MG tablet    famotidine (PEPCID) 20 MG tablet   Possibly peptic ulcer disease, severe IBS, GERD, gastritis, also possible for other lower GI pathology?   2. Generalized abdominal pain  R10.84 DG Abd 1 View    pantoprazole (PROTONIX) 40 MG tablet    POCT urinalysis dipstick   See #1, continue to work on constipation, given info about irritable bowel, diet for IBS and GERD UA was done today unremarkable, patient sent across the street for KUB to assess stool burden Patient was educated on treatment for constipation, encouraged her to try MiraLAX bowel cleanse or milk of magnesia if there is a large stool burden, after bowels are moving and little bit softer encouraged her to increase fiber and fluid intake.  She may possibly have IBS, she asked several questions about IBS and we discussed the condition and diagnosis.  She is given a handout information regarding it.  Could possibly be IBS with the history given she notes long history of GI problems that seem closely associated with mood stress anxiety etc. And slightly concerned with her postprandial nausea and intermittent vomiting may have peptic ulcer?  She is having some improvement of her upper GI symptoms since starting Prilosec over the  past week encouraged her to switch to prescription higher dose Protonix for least the next 2 to 4 weeks, encouraged her to look at the handout carefully for diet lifestyle changes for GERD acid reflux she should avoid alcohol, NSAIDs, spicy greasy foods.  For now with lower GI and upper GI symptoms her diet should be fairly bland, very small portions with ample fluids progress diet gradually and slowly.  They want her to follow-up in  the next 1 to 2 months if not improving     3. Pain of right hand  M79.641 DG Hand Complete Right   Very hesitant to give details of injury, x-ray of hand ordered  4. Hyperpigmentation of skin  L81.9    Possible melasma-patient wants referral to dermatology but insurance is changing in a few weeks-encouraged her to notify us when she is ready for the referral        Jane Grana, PA-C 03/03/20 2:58 PM

## 2020-03-03 NOTE — Patient Instructions (Addendum)
Start protonix and can use pepcid as needed in addition for reflux symptoms  Call me or send a mychart message when you have insurance info/changes and want the dermatology referral put through  Irritable Bowel Syndrome, Adult  Irritable bowel syndrome (IBS) is a group of symptoms that affects the organs responsible for digestion (gastrointestinal or GI tract). IBS is not one specific disease. To regulate how the GI tract works, the body sends signals back and forth between the intestines and the brain. If you have IBS, there may be a problem with these signals. As a result, the GI tract does not function normally. The intestines may become more sensitive and overreact to certain things. This may be especially true when you eat certain foods or when you are under stress. There are four types of IBS. These may be determined based on the consistency of your stool (feces):  IBS with diarrhea.  IBS with constipation.  Mixed IBS.  Unsubtyped IBS. It is important to know which type of IBS you have. Certain treatments are more likely to be helpful for certain types of IBS. What are the causes? The exact cause of IBS is not known. What increases the risk? You may have a higher risk for IBS if you:  Are female.  Are younger than 41.  Have a family history of IBS.  Have a mental health condition, such as depression, anxiety, or post-traumatic stress disorder.  Have had a bacterial infection of your GI tract. What are the signs or symptoms? Symptoms of IBS vary from person to person. The main symptom is abdominal pain or discomfort. Other symptoms usually include one or more of the following:  Diarrhea, constipation, or both.  Abdominal swelling or bloating.  Feeling full after eating a small or regular-sized meal.  Frequent gas.  Mucus in the stool.  A feeling of having more stool left after a bowel movement. Symptoms tend to come and go. They may be triggered by stress, mental  health conditions, or certain foods. How is this diagnosed? This condition may be diagnosed based on a physical exam, your medical history, and your symptoms. You may have tests, such as:  Blood tests.  Stool test.  X-rays.  CT scan.  Colonoscopy. This is a procedure in which your GI tract is viewed with a long, thin, flexible tube. How is this treated? There is no cure for IBS, but treatment can help relieve symptoms. Treatment depends on the type of IBS you have, and may include:  Changes to your diet, such as: ? Avoiding foods that cause symptoms. ? Drinking more water. ? Following a low-FODMAP (fermentable oligosaccharides, disaccharides, monosaccharides, and polyols) diet for up to 6 weeks, or as told by your health care provider. FODMAPs are sugars that are hard for some people to digest. ? Eating more fiber. ? Eating medium-sized meals at the same times every day.  Medicines. These may include: ? Fiber supplements, if you have constipation. ? Medicine to control diarrhea (antidiarrheal medicines). ? Medicine to help control muscle tightening (spasms) in your GI tract (antispasmodic medicines). ? Medicines to help with mental health conditions, such as antidepressants or tranquilizers.  Talk therapy or counseling.  Working with a diet and nutrition specialist (dietitian) to help create a food plan that is right for you.  Managing your stress. Follow these instructions at home: Eating and drinking  Eat a healthy diet.  Eat medium-sized meals at about the same time every day. Do not eat large meals.  Gradually eat more fiber-rich foods. These include whole grains, fruits, and vegetables. This may be especially helpful if you have IBS with constipation.  Eat a diet low in FODMAPs.  Drink enough fluid to keep your urine pale yellow.  Keep a journal of foods that seem to trigger symptoms.  Avoid foods and drinks that: ? Contain added sugar. ? Make your symptoms  worse. Dairy products, caffeinated drinks, and carbonated drinks can make symptoms worse for some people. General instructions  Take over-the-counter and prescription medicines and supplements only as told by your health care provider.  Get enough exercise. Do at least 150 minutes of moderate-intensity exercise each week.  Manage your stress. Getting enough sleep and exercise can help you manage stress.  Keep all follow-up visits as told by your health care provider and therapist. This is important. Alcohol Use  Do not drink alcohol if: ? Your health care provider tells you not to drink. ? You are pregnant, may be pregnant, or are planning to become pregnant.  If you drink alcohol, limit how much you have: ? 0-1 drink a day for women. ? 0-2 drinks a day for men.  Be aware of how much alcohol is in your drink. In the [place] one drink equals one typical bottle of beer (12 oz), one-half glass of wine (5 oz), or one shot of hard liquor (1 oz). Contact a health care provider if you have:  Constant pain.  Weight loss.  Difficulty or pain when swallowing.  Diarrhea that gets worse. Get help right away if you have:  Severe abdominal pain.  Fever.  Diarrhea with symptoms of dehydration, such as dizziness or dry mouth.  Bright red blood in your stool.  Stool that is black and tarry.  Abdominal swelling.  Vomiting that does not stop.  Blood in your vomit. Summary  Irritable bowel syndrome (IBS) is not one specific disease. It is a group of symptoms that affects digestion.  Your intestines may become more sensitive and overreact to certain things. This may be especially true when you eat certain foods or when you are under stress.  There is no cure for IBS, but treatment can help relieve symptoms. This information is not intended to replace advice given to you by your health care provider. Make sure you discuss any questions you have with your health care provider. Document  Revised: 08/27/2017 Document Reviewed: 08/27/2017 Elsevier Patient Education  2020 [place].   Diet for Irritable Bowel Syndrome When you have irritable bowel syndrome (IBS), it is very important to eat the foods and follow the eating habits that are best for your condition. IBS may cause various symptoms such as pain in the abdomen, constipation, or diarrhea. Choosing the right foods can help to ease the discomfort from these symptoms. Work with your health care provider and diet and nutrition specialist (dietitian) to find the eating plan that will help to control your symptoms. What are tips for following this plan?      Keep a food diary. This will help you identify foods that cause symptoms. Write down: ? What you eat and when you eat it. ? What symptoms you have. ? When symptoms occur in relation to your meals, such as "pain in abdomen 2 hours after dinner."  Eat your meals slowly and in a relaxed setting.  Aim to eat 5-6 small meals per day. Do not skip meals.  Drink enough fluid to keep your urine pale yellow.  Ask your health care  provider if you should take an over-the-counter probiotic to help restore healthy bacteria in your gut (digestive tract). ? Probiotics are foods that contain good bacteria and yeasts.  Your dietitian may have specific dietary recommendations for you based on your symptoms. He or she may recommend that you: ? Avoid foods that cause symptoms. Talk with your dietitian about other ways to get the same nutrients that are in those problem foods. ? Avoid foods with gluten. Gluten is a protein that is found in rye, wheat, and barley. ? Eat more foods that contain soluble fiber. Examples of foods with high soluble fiber include oats, seeds, and certain fruits and vegetables. Take a fiber supplement if directed by your dietitian. ? Reduce or avoid certain foods called FODMAPs. These are foods that contain carbohydrates that are hard to digest. Ask your  doctor which foods contain these carbohydrates. What foods are not recommended? The following are some foods and drinks that may make your symptoms worse:  Fatty foods, such as french fries.  Foods that contain gluten, such as pasta and cereal.  Dairy products, such as milk, cheese, and ice cream.  Chocolate.  Alcohol.  Products with caffeine, such as coffee.  Carbonated drinks, such as soda.  Foods that are high in FODMAPs. These include certain fruits and vegetables.  Products with sweeteners such as honey, high fructose corn syrup, sorbitol, and mannitol. The items listed above may not be a complete list of foods and beverages you should avoid. Contact a dietitian for more information. What foods are good sources of fiber? Your health care provider or dietitian may recommend that you eat more foods that contain fiber. Fiber can help to reduce constipation and other IBS symptoms. Add foods with fiber to your diet a little at a time so your body can get used to them. Too much fiber at one time might cause gas and swelling of your abdomen. The following are some foods that are good sources of fiber:  Berries, such as raspberries, strawberries, and blueberries.  Tomatoes.  Carrots.  Brown rice.  Oats.  Seeds, such as chia and pumpkin seeds. The items listed above may not be a complete list of recommended sources of fiber. Contact your dietitian for more options. Where to find more information  International Foundation for Functional Gastrointestinal Disorders: www.iffgd.[employer] of Diabetes and Digestive and Kidney Diseases: [employer] Summary  When you have irritable bowel syndrome (IBS), it is very important to eat the foods and follow the eating habits that are best for your condition.  IBS may cause various symptoms such as pain in the abdomen, constipation, or diarrhea.  Choosing the right foods can help to ease the discomfort that comes from  symptoms.  Keep a food diary. This will help you identify foods that cause symptoms.  Your health care provider or diet and nutrition specialist (dietitian) may recommend that you eat more foods that contain fiber. This information is not intended to replace advice given to you by your health care provider. Make sure you discuss any questions you have with your health care provider. Document Revised: 12/24/2018 Document Reviewed: 05/07/2017 Elsevier Patient Education  2020 [place] for Gastroesophageal Reflux Disease, Adult When you have gastroesophageal reflux disease (GERD), the foods you eat and your eating habits are very important. Choosing the right foods can help ease your discomfort. Think about working with a nutrition specialist (dietitian) to help you make good choices. What are tips for  following this plan?  Meals  Choose healthy foods that are low in fat, such as fruits, vegetables, whole grains, low-fat dairy products, and lean meat, fish, and poultry.  Eat small meals often instead of 3 large meals a day. Eat your meals slowly, and in a place where you are relaxed. Avoid bending over or lying down until 2-3 hours after eating.  Avoid eating meals 2-3 hours before bed.  Avoid drinking a lot of liquid with meals.  Cook foods using methods other than frying. Bake, grill, or broil food instead.  Avoid or limit: ? Chocolate. ? Peppermint or spearmint. ? Alcohol. ? Pepper. ? Black and decaffeinated coffee. ? Black and decaffeinated tea. ? Bubbly (carbonated) soft drinks. ? Caffeinated energy drinks and soft drinks.  Limit high-fat foods such as: ? Fatty meat or fried foods. ? Whole milk, cream, butter, or ice cream. ? Nuts and nut butters. ? Pastries, donuts, and sweets made with butter or shortening.  Avoid foods that cause symptoms. These foods may be different for everyone. Common foods that cause symptoms include: ? Tomatoes. ? Oranges,  lemons, and limes. ? Peppers. ? Spicy food. ? Onions and garlic. ? Vinegar. Lifestyle  Maintain a healthy weight. Ask your doctor what weight is healthy for you. If you need to lose weight, work with your doctor to do so safely.  Exercise for at least 30 minutes for 5 or more days each week, or as told by your doctor.  Wear loose-fitting clothes.  Do not smoke. If you need help quitting, ask your doctor.  Sleep with the head of your bed higher than your feet. Use a wedge under the mattress or blocks under the bed frame to raise the head of the bed. Summary  When you have gastroesophageal reflux disease (GERD), food and lifestyle choices are very important in easing your symptoms.  Eat small meals often instead of 3 large meals a day. Eat your meals slowly, and in a place where you are relaxed.  Limit high-fat foods such as fatty meat or fried foods.  Avoid bending over or lying down until 2-3 hours after eating.  Avoid peppermint and spearmint, caffeine, alcohol, and chocolate. This information is not intended to replace advice given to you by your health care provider. Make sure you discuss any questions you have with your health care provider. Document Revised: 12/25/2018 Document Reviewed: 10/09/2016 Elsevier Patient Education  [place].

## 2020-03-05 LAB — COMPLETE METABOLIC PANEL WITH GFR
AG Ratio: 2.3 (calc) (ref 1.0–2.5)
ALT: 12 U/L (ref 6–29)
AST: 16 U/L (ref 10–30)
Albumin: 4.9 g/dL (ref 3.6–5.1)
Alkaline phosphatase (APISO): 55 U/L (ref 31–125)
BUN: 17 mg/dL (ref 7–25)
CO2: 28 mmol/L (ref 20–32)
Calcium: 9 mg/dL (ref 8.6–10.2)
Chloride: 105 mmol/L (ref 98–110)
Creat: 1.03 mg/dL (ref 0.50–1.10)
GFR, Est African American: 86 mL/min/{1.73_m2} (ref >=60)
GFR, Est Non African American: 74 mL/min/{1.73_m2} (ref >=60)
Globulin: 2.1 g/dL (calc) (ref 1.9–3.7)
Glucose, Bld: 73 mg/dL (ref 65–99)
Potassium: 4.1 mmol/L (ref 3.5–5.3)
Sodium: 139 mmol/L (ref 135–146)
Total Bilirubin: 0.4 mg/dL (ref 0.2–1.2)
Total Protein: 7 g/dL (ref 6.1–8.1)

## 2020-03-05 LAB — QUANTIFERON-TB GOLD PLUS
Mitogen-NIL: 9.6 IU/mL
NIL: 0.02 IU/mL
QuantiFERON-TB Gold Plus: NEGATIVE
TB1-NIL: 0 IU/mL
TB2-NIL: 0 IU/mL

## 2020-03-05 LAB — CBC WITH DIFFERENTIAL/PLATELET
Absolute Monocytes: 781 cells/uL (ref 200–950)
Basophils Absolute: 34 cells/uL (ref 0–200)
Basophils Relative: 0.4 %
Eosinophils Absolute: 42 cells/uL (ref 15–500)
Eosinophils Relative: 0.5 %
HCT: 41.2 % (ref 35.0–45.0)
Hemoglobin: 14 g/dL (ref 11.7–15.5)
Lymphs Abs: 1268 cells/uL (ref 850–3900)
MCH: 32 pg (ref 27.0–33.0)
MCHC: 34 g/dL (ref 32.0–36.0)
MCV: 94.3 fL (ref 80.0–100.0)
MPV: 11.1 fL (ref 7.5–12.5)
Monocytes Relative: 9.3 %
Neutro Abs: 6275 cells/uL (ref 1500–7800)
Neutrophils Relative %: 74.7 %
Platelets: 217 10*3/uL (ref 140–400)
RBC: 4.37 10*6/uL (ref 3.80–5.10)
RDW: 11.3 % (ref 11.0–15.0)
Total Lymphocyte: 15.1 %
WBC: 8.4 10*3/uL (ref 3.8–10.8)

## 2020-03-05 LAB — LIPID PANEL
Cholesterol: 136 mg/dL (ref <200)
HDL: 48 mg/dL — ABNORMAL LOW (ref >=50)
LDL Cholesterol (Calc): 75 mg/dL (calc)
Non-HDL Cholesterol (Calc): 88 mg/dL (calc) (ref <130)
Total CHOL/HDL Ratio: 2.8 (calc) (ref <5.0)
Triglycerides: 58 mg/dL (ref <150)

## 2020-03-05 LAB — HEMOGLOBIN A1C
Hgb A1c MFr Bld: 4.7 % of total Hgb (ref <5.7)
Mean Plasma Glucose: 88 (calc)
eAG (mmol/L): 4.9 (calc)

## 2020-03-05 LAB — TSH: TSH: 0.51 mIU/L

## 2020-03-15 ENCOUNTER — Telehealth: Payer: Self-pay

## 2020-03-15 NOTE — Telephone Encounter (Signed)
Copied from [place] [id number]. Topic: General - Other >> Mar 15, 2020  8:58 AM Marya Landry D wrote: Reason for CRM: Patient would like to speak with her physician or nurse regarding ongoing symptoms previously discussed (constipation), patient would like advice prior to scheduling a appt.please advise

## 2020-03-15 NOTE — Telephone Encounter (Signed)
Pt called states she has been doing mirlax according to bottle and last night took 2 laxitives.  She states has went 3x today and it is not hard, but feels like its not all out.  I told her to continue mirlax over the next couple of days, drink plenty of water and stay active.  If continues schedule an appt or go to urgent care.

## 2020-04-02 ENCOUNTER — Encounter: Payer: Self-pay | Admitting: Family Medicine

## 2020-04-02 DIAGNOSIS — L989 Disorder of the skin and subcutaneous tissue, unspecified: Secondary | ICD-10-CM

## 2020-04-02 DIAGNOSIS — L819 Disorder of pigmentation, unspecified: Secondary | ICD-10-CM

## 2020-04-04 NOTE — Telephone Encounter (Signed)
Prior OV:  03/03/2020:  Hyperpigmentation of skin  L81.9     Possible melasma-patient wants referral to dermatology but insurance is changing in a few weeks-encouraged her to notify us when she is ready for the referral   Initial visit with physical on 08/10/2019: Discussed atypical lesions - she does have some skin lesions to right hand - appear similar to warts - she would like referral to dermatology 8. Skin lesion of hand  L98.9 Ambulatory referral to Dermatology   looks like warts/molluscum?  refer to derm - pt has other concerns that she wants evaluated, but she did not disclose - said "she'll have them look at other stuff"    New insurance - pt sent message asking for referral to be put in again.    ICD-10-CM   1. Hyperpigmentation of skin  L81.9 Ambulatory referral to Dermatology  2. Skin lesion of hand  L98.9 Ambulatory referral to Dermatology   Delsa Grana, PA-C

## 2020-04-26 ENCOUNTER — Ambulatory Visit: Admitting: Family Medicine

## 2020-06-01 NOTE — Progress Notes (Signed)
Name: Jane Nichols   MRN: [id number]    DOB: 12-26-1990   Date:06/02/2020       Progress Note  Subjective:    Chief Complaint  Chief Complaint  Patient presents with  . Irritable Bowel Syndrome    I connected with  [name] on 06/02/20 at  2:40 PM EDT by telephone and verified that I am speaking with the correct person using two identifiers.   I discussed the limitations, risks, security and privacy concerns of performing an evaluation and management service by telephone and the availability of in person appointments. Staff also discussed with the patient that there may be a patient responsible charge related to this service.  Pt verbalizes understanding and agrees to proceed with telephone encounter today  Patient Location: hotel Provider Location: [facility] Additional Individuals present: none  HPI Upper GI sx - she tried protonix and pepcid and that helped with sx She has been watching what she eats to see how it affects her GI sx She still has some bloating and doesn't know what it specifically causing it She's tried a probiotic Her BMs are more consistent and she's been less constipated  Her SOB she thinks is from [place], which she tried to switch to in order to help her stop smoking cigarettes.    Patient Active Problem List   Diagnosis Date Noted  . Irritable bowel syndrome 06/02/2020  . Current smoker 06/02/2020  . Hyperpigmentation of skin 06/02/2020  . GAD (generalized anxiety disorder) 06/02/2020    Social History   Tobacco Use  . Smoking status: Former Smoker    Packs/day: 0.50    Years: 4.00    Pack years: 2.00  . Smokeless tobacco: Never Used  Substance Use Topics  . Alcohol use: Yes    Alcohol/week: 1.0 standard drink    Types: 1 Cans of beer per week     Current Outpatient Medications:  .  famotidine (PEPCID) 20 MG tablet, Take 1 tablet (20 mg total) by mouth 2 (two) times daily as needed for heartburn or indigestion., Disp:  30 tablet, Rfl: 0 .  pantoprazole (PROTONIX) 40 MG tablet, Take 1 tablet (40 mg total) by mouth daily. Empty stomach, do not eat for 30 min, Disp: 30 tablet, Rfl: 3  No Known Allergies  Chart Review: I personally reviewed active problem list, medication list, allergies, family history, social history, health maintenance, notes from last encounter, lab results, imaging with the patient/caregiver today.  Review of Systems 10 Systems reviewed and are negative for acute change except as noted in the HPI.   Objective:    Virtual encounter, vitals limited, only able to obtain the following Today's Vitals   06/02/20 1426  Weight: 150 lb (68 kg)  Height: 5\' 4"  (1.626 m)   Body mass index is 25.75 kg/m. Nursing Note and Vital Signs reviewed.  Physical Exam Phonation clear, pt alert PE limited by telephone encounter  No results found for this or any previous visit (from the past 72 hour(s)).  Assessment and Plan:     ICD-10-CM   1. Postprandial abdominal bloating  R14.0 Ambulatory referral to Gastroenterology   encouraged to continue diet/lifestyle efforts, pepcid prn, f/up GI - do suspect IBS  2. Irritable bowel syndrome, unspecified type  K58.9 Ambulatory referral to Gastroenterology   continue diet efforts, food journal - info given on IBS and low-fodmap eating plan, encouraged food avoidance in isolated trials ie avoid dairy for 1 week etc  3. Tobacco  use  Z72.0 nicotine (NICODERM CQ) 21 mg/24hr patch    nicotine (NICODERM CQ - DOSED IN MG/24 HOURS) 14 mg/24hr patch    nicotine (NICODERM CQ - DOSED IN MG/24 HR) 7 mg/24hr patch  4. Encounter for smoking cessation counseling  Z71.6 nicotine (NICODERM CQ) 21 mg/24hr patch    nicotine (NICODERM CQ - DOSED IN MG/24 HOURS) 14 mg/24hr patch    nicotine (NICODERM CQ - DOSED IN MG/24 HR) 7 mg/24hr patch  5. Current smoker  F17.200 nicotine (NICODERM CQ) 21 mg/24hr patch    nicotine (NICODERM CQ - DOSED IN MG/24 HOURS) 14 mg/24hr patch      nicotine (NICODERM CQ - DOSED IN MG/24 HR) 7 mg/24hr patch   Smoking cessation instruction/counseling given:  counseled patient on the dangers of tobacco use, advised patient to stop smoking, and reviewed strategies to maximize success   - I discussed the assessment and treatment plan with the patient. The patient was provided an opportunity to ask questions and all were answered. The patient agreed with the plan and demonstrated an understanding of the instructions.  - The patient was advised to call back or seek an in-person evaluation if the symptoms worsen or if the condition fails to improve as anticipated.  I provided 20+ minutes of non-face-to-face time during this encounter.  Delsa Grana, PA-C 06/02/20 3:02 PM

## 2020-06-02 ENCOUNTER — Telehealth (INDEPENDENT_AMBULATORY_CARE_PROVIDER_SITE_OTHER): Payer: 59 | Admitting: Family Medicine

## 2020-06-02 ENCOUNTER — Encounter: Payer: Self-pay | Admitting: Family Medicine

## 2020-06-02 VITALS — Ht 64.0 in | Wt 150.0 lb

## 2020-06-02 DIAGNOSIS — Z720 Tobacco use: Secondary | ICD-10-CM | POA: Diagnosis not present

## 2020-06-02 DIAGNOSIS — Z716 Tobacco abuse counseling: Secondary | ICD-10-CM | POA: Diagnosis not present

## 2020-06-02 DIAGNOSIS — L819 Disorder of pigmentation, unspecified: Secondary | ICD-10-CM | POA: Insufficient documentation

## 2020-06-02 DIAGNOSIS — K589 Irritable bowel syndrome without diarrhea: Secondary | ICD-10-CM | POA: Diagnosis not present

## 2020-06-02 DIAGNOSIS — R140 Abdominal distension (gaseous): Secondary | ICD-10-CM | POA: Diagnosis not present

## 2020-06-02 DIAGNOSIS — F17200 Nicotine dependence, unspecified, uncomplicated: Secondary | ICD-10-CM

## 2020-06-02 MED ORDER — NICOTINE 7 MG/24HR TD PT24: 7.0000 mg | MEDICATED_PATCH | Freq: Every day | TRANSDERMAL | 0 refills | Status: AC

## 2020-06-02 MED ORDER — NICOTINE 21 MG/24HR TD PT24: 21.0000 mg | MEDICATED_PATCH | Freq: Every day | TRANSDERMAL | 0 refills | Status: AC

## 2020-06-02 MED ORDER — NICOTINE 14 MG/24HR TD PT24: 14.0000 mg | MEDICATED_PATCH | Freq: Every day | TRANSDERMAL | 0 refills | Status: AC

## 2020-06-02 NOTE — Patient Instructions (Addendum)
Check out [employer] to see lower prices for nicotine patches  See CDC info on nicotine patches and tips on how to use and quit  We also have a local team that helps with smoking cessation Ask you employer or insurance plan if they have any stop smoking plan or have patches/gum etc that they offer to help you stop smoking  https://www.bennett.biz/   Low-FODMAP Eating Plan  FODMAPs (fermentable oligosaccharides, disaccharides, monosaccharides, and polyols) are sugars that are hard for some people to digest. A low-FODMAP eating plan may help some people who have bowel (intestinal) diseases to manage their symptoms. This meal plan can be complicated to follow. Work with a diet and nutrition specialist (dietitian) to make a low-FODMAP eating plan that is right for you. A dietitian can make sure that you get enough nutrition from this diet. What are tips for following this plan? Reading food labels  Check labels for hidden FODMAPs such as: ? High-fructose syrup. ? Honey. ? Agave. ? Natural fruit flavors. ? Onion or garlic powder.  Choose low-FODMAP foods that contain 3-4 grams of fiber per serving.  Check food labels for serving sizes. Eat only one serving at a time to make sure FODMAP levels stay low. Meal planning  Follow a low-FODMAP eating plan for up to 6 weeks, or as told by your health care provider or dietitian.  To follow the eating plan: 1. Eliminate high-FODMAP foods from your diet completely. 2. Gradually reintroduce high-FODMAP foods into your diet one at a time. Most people should wait a few days after introducing one high-FODMAP food before they introduce the next high-FODMAP food. Your dietitian can recommend how quickly you may reintroduce foods. 3. Keep a daily record of what you eat and drink, and make note of any symptoms that you have after  eating. 4. Review your daily record with a dietitian regularly. Your dietitian can help you identify which foods you can eat and which foods you should avoid. General tips  Drink enough fluid each day to keep your urine pale yellow.  Avoid processed foods. These often have added sugar and may be high in FODMAPs.  Avoid most dairy products, whole grains, and sweeteners.  Work with a dietitian to make sure you get enough fiber in your diet. Recommended foods Grains  Gluten-free grains, such as rice, oats, buckwheat, quinoa, corn, polenta, and millet. Gluten-free pasta, bread, or cereal. Rice noodles. Corn tortillas. Vegetables  Eggplant, zucchini, cucumber, peppers, green beans, Brussels sprouts, bean sprouts, lettuce, arugula, kale, Swiss chard, spinach, collard greens, bok choy, summer squash, potato, and tomato. Limited amounts of corn, carrot, and sweet potato. Green parts of scallions. Fruits  Bananas, oranges, lemons, limes, blueberries, raspberries, strawberries, grapes, cantaloupe, honeydew melon, kiwi, papaya, passion fruit, and pineapple. Limited amounts of dried cranberries, banana chips, and shredded coconut. Dairy  Lactose-free milk, yogurt, and kefir. Lactose-free cottage cheese and ice cream. Non-dairy milks, such as almond, coconut, hemp, and rice milk. Yogurts made of non-dairy milks. Limited amounts of goat cheese, brie, mozzarella, parmesan, swiss, and other hard cheeses. Meats and other protein foods  Unseasoned beef, pork, poultry, or fish. Eggs. Berniece Salines. Tofu (firm) and tempeh. Limited amounts of nuts and seeds, such as almonds, walnuts, [place] nuts, pecans, peanuts, pumpkin seeds, chia seeds, and sunflower seeds. Fats and oils  Butter-free spreads. Vegetable oils, such as olive, canola, and sunflower oil. Seasoning and other foods  Artificial sweeteners with names that do not end in "ol" such as aspartame, saccharine, and stevia.  Maple syrup, white table sugar, raw  sugar, brown sugar, and molasses. Fresh basil, coriander, parsley, rosemary, and thyme. Beverages  Water and mineral water. Sugar-sweetened soft drinks. Small amounts of orange juice or cranberry juice. Black and green tea. Most dry wines. Coffee. This may not be a complete list of low-FODMAP foods. Talk with your dietitian for more information. Foods to avoid Grains  Wheat, including kamut, durum, and semolina. Barley and bulgur. Couscous. Wheat-based cereals. Wheat noodles, bread, crackers, and pastries. Vegetables  Chicory root, artichoke, asparagus, cabbage, snow peas, sugar snap peas, mushrooms, and cauliflower. Onions, garlic, leeks, and the white part of scallions. Fruits  Fresh, dried, and juiced forms of apple, pear, watermelon, peach, plum, cherries, apricots, blackberries, boysenberries, figs, nectarines, and mango. Avocado. Dairy  Milk, yogurt, ice cream, and soft cheese. Cream and sour cream. Milk-based sauces. Custard. Meats and other protein foods  Fried or fatty meat. Sausage. Cashews and pistachios. Soybeans, baked beans, black beans, chickpeas, kidney beans, fava beans, navy beans, lentils, and split peas. Seasoning and other foods  Any sugar-free gum or candy. Foods that contain artificial sweeteners such as sorbitol, mannitol, isomalt, or xylitol. Foods that contain honey, high-fructose corn syrup, or agave. Bouillon, vegetable stock, beef stock, and chicken stock. Garlic and onion powder. Condiments made with onion, such as hummus, chutney, pickles, relish, salad dressing, and salsa. Tomato paste. Beverages  Chicory-based drinks. Coffee substitutes. Chamomile tea. Fennel tea. Sweet or fortified wines such as port or sherry. Diet soft drinks made with isomalt, mannitol, maltitol, sorbitol, or xylitol. Apple, pear, and mango juice. Juices with high-fructose corn syrup. This may not be a complete list of high-FODMAP foods. Talk with your dietitian to discuss what dietary  choices are best for you.  Summary  A low-FODMAP eating plan is a short-term diet that eliminates FODMAPs from your diet to help ease symptoms of certain bowel diseases.  The eating plan usually lasts up to 6 weeks. After that, high-FODMAP foods are restarted gradually, one at a time, so you can find out which may be causing symptoms.  A low-FODMAP eating plan can be complicated. It is best to work with a dietitian who has experience with this type of plan. This information is not intended to replace advice given to you by your health care provider. Make sure you discuss any questions you have with your health care provider. Document Revised: 08/16/2017 Document Reviewed: 04/30/2017 Elsevier Patient Education  [place].   Diet for Irritable Bowel Syndrome When you have irritable bowel syndrome (IBS), it is very important to eat the foods and follow the eating habits that are best for your condition. IBS may cause various symptoms such as pain in the abdomen, constipation, or diarrhea. Choosing the right foods can help to ease the discomfort from these symptoms. Work with your health care provider and diet and nutrition specialist (dietitian) to find the eating plan that will help to control your symptoms. What are tips for following this plan?      Keep a food diary. This will help you identify foods that cause symptoms. Write down: ? What you eat and when you eat it. ? What symptoms you have. ? When symptoms occur in relation to your meals, such as "pain in abdomen 2 hours after dinner."  Eat your meals slowly and in a relaxed setting.  Aim to eat 5-6 small meals per day. Do not skip meals.  Drink enough fluid to keep your urine pale yellow.  Ask your  health care provider if you should take an over-the-counter probiotic to help restore healthy bacteria in your gut (digestive tract). ? Probiotics are foods that contain good bacteria and yeasts.  Your dietitian may have  specific dietary recommendations for you based on your symptoms. He or she may recommend that you: ? Avoid foods that cause symptoms. Talk with your dietitian about other ways to get the same nutrients that are in those problem foods. ? Avoid foods with gluten. Gluten is a protein that is found in rye, wheat, and barley. ? Eat more foods that contain soluble fiber. Examples of foods with high soluble fiber include oats, seeds, and certain fruits and vegetables. Take a fiber supplement if directed by your dietitian. ? Reduce or avoid certain foods called FODMAPs. These are foods that contain carbohydrates that are hard to digest. Ask your doctor which foods contain these carbohydrates. What foods are not recommended? The following are some foods and drinks that may make your symptoms worse:  Fatty foods, such as french fries.  Foods that contain gluten, such as pasta and cereal.  Dairy products, such as milk, cheese, and ice cream.  Chocolate.  Alcohol.  Products with caffeine, such as coffee.  Carbonated drinks, such as soda.  Foods that are high in FODMAPs. These include certain fruits and vegetables.  Products with sweeteners such as honey, high fructose corn syrup, sorbitol, and mannitol. The items listed above may not be a complete list of foods and beverages you should avoid. Contact a dietitian for more information. What foods are good sources of fiber? Your health care provider or dietitian may recommend that you eat more foods that contain fiber. Fiber can help to reduce constipation and other IBS symptoms. Add foods with fiber to your diet a little at a time so your body can get used to them. Too much fiber at one time might cause gas and swelling of your abdomen. The following are some foods that are good sources of fiber:  Berries, such as raspberries, strawberries, and blueberries.  Tomatoes.  Carrots.  Brown rice.  Oats.  Seeds, such as chia and pumpkin seeds. The  items listed above may not be a complete list of recommended sources of fiber. Contact your dietitian for more options. Where to find more information  International Foundation for Functional Gastrointestinal Disorders: www.iffgd.[employer] of Diabetes and Digestive and Kidney Diseases: [employer] Summary  When you have irritable bowel syndrome (IBS), it is very important to eat the foods and follow the eating habits that are best for your condition.  IBS may cause various symptoms such as pain in the abdomen, constipation, or diarrhea.  Choosing the right foods can help to ease the discomfort that comes from symptoms.  Keep a food diary. This will help you identify foods that cause symptoms.  Your health care provider or diet and nutrition specialist (dietitian) may recommend that you eat more foods that contain fiber. This information is not intended to replace advice given to you by your health care provider. Make sure you discuss any questions you have with your health care provider. Document Revised: 12/24/2018 Document Reviewed: 05/07/2017 Elsevier Patient Education  2020 [employer].   Steps to Quit Smoking Smoking tobacco is the leading cause of preventable death. It can affect almost every organ in the body. Smoking puts you and people around you at risk for many serious, long-lasting (chronic) diseases. Quitting smoking can be hard, but it is one of the best things  that you can do for your health. It is never too late to quit. How do I get ready to quit? When you decide to quit smoking, make a plan to help you succeed. Before you quit:  Pick a date to quit. Set a date within the next 2 weeks to give you time to prepare.  Write down the reasons why you are quitting. Keep this list in places where you will see it often.  Tell your family, friends, and co-workers that you are quitting. Their support is important.  Talk with your doctor about the choices  that may help you quit.  Find out if your health insurance will pay for these treatments.  Know the people, places, things, and activities that make you want to smoke (triggers). Avoid them. What first steps can I take to quit smoking?  Throw away all cigarettes at home, at work, and in your car.  Throw away the things that you use when you smoke, such as ashtrays and lighters.  Clean your car. Make sure to empty the ashtray.  Clean your home, including curtains and carpets. What can I do to help me quit smoking? Talk with your doctor about taking medicines and seeing a counselor at the same time. You are more likely to succeed when you do both.  If you are pregnant or breastfeeding, talk with your doctor about counseling or other ways to quit smoking. Do not take medicine to help you quit smoking unless your doctor tells you to do so. To quit smoking: Quit right away  Quit smoking totally, instead of slowly cutting back on how much you smoke over a period of time.  Go to counseling. You are more likely to quit if you go to counseling sessions regularly. Take medicine You may take medicines to help you quit. Some medicines need a prescription, and some you can buy over-the-counter. Some medicines may contain a drug called nicotine to replace the nicotine in cigarettes. Medicines may:  Help you to stop having the desire to smoke (cravings).  Help to stop the problems that come when you stop smoking (withdrawal symptoms). Your doctor may ask you to use:  Nicotine patches, gum, or lozenges.  Nicotine inhalers or sprays.  Non-nicotine medicine that is taken by mouth. Find resources Find resources and other ways to help you quit smoking and remain smoke-free after you quit. These resources are most helpful when you use them often. They include:  Online chats with a Social worker.  Phone quitlines.  Printed Furniture conservator/restorer.  Support groups or group counseling.  Text  messaging programs.  Mobile phone apps. Use apps on your mobile phone or tablet that can help you stick to your quit plan. There are many free apps for mobile phones and tablets as well as websites. Examples include Quit Guide from the [employer] and smokefree.gov  What things can I do to make it easier to quit?   Talk to your family and friends. Ask them to support and encourage you.  Call a phone quitline (1-800-QUIT-NOW), reach out to support groups, or work with a Social worker.  Ask people who smoke to not smoke around you.  Avoid places that make you want to smoke, such as: ? Bars. ? Parties. ? Smoke-break areas at work.  Spend time with people who do not smoke.  Lower the stress in your life. Stress can make you want to smoke. Try these things to help your stress: ? Getting regular exercise. ? Doing deep-breathing exercises. ?  Doing yoga. ? Meditating. ? Doing a body scan. To do this, close your eyes, focus on one area of your body at a time from head to toe. Notice which parts of your body are tense. Try to relax the muscles in those areas. How will I feel when I quit smoking? Day 1 to 3 weeks Within the first 24 hours, you may start to have some problems that come from quitting tobacco. These problems are very bad 2-3 days after you quit, but they do not often last for more than 2-3 weeks. You may get these symptoms:  Mood swings.  Feeling restless, nervous, angry, or annoyed.  Trouble concentrating.  Dizziness.  Strong desire for high-sugar foods and nicotine.  Weight gain.  Trouble pooping (constipation).  Feeling like you may vomit (nausea).  Coughing or a sore throat.  Changes in how the medicines that you take for other issues work in your body.  Depression.  Trouble sleeping (insomnia). Week 3 and afterward After the first 2-3 weeks of quitting, you may start to notice more positive results, such as:  Better sense of smell and taste.  Less coughing and  sore throat.  Slower heart rate.  Lower blood pressure.  Clearer skin.  Better breathing.  Fewer sick days. Quitting smoking can be hard. Do not give up if you fail the first time. Some people need to try a few times before they succeed. Do your best to stick to your quit plan, and talk with your doctor if you have any questions or concerns. Summary  Smoking tobacco is the leading cause of preventable death. Quitting smoking can be hard, but it is one of the best things that you can do for your health.  When you decide to quit smoking, make a plan to help you succeed.  Quit smoking right away, not slowly over a period of time.  When you start quitting, seek help from your doctor, family, or friends. This information is not intended to replace advice given to you by your health care provider. Make sure you discuss any questions you have with your health care provider. Document Revised: 05/29/2019 Document Reviewed: 11/22/2018 Elsevier Patient Education  [place].

## 2020-07-28 ENCOUNTER — Ambulatory Visit: Admitting: Dermatology

## 2020-07-28 ENCOUNTER — Other Ambulatory Visit: Payer: Self-pay

## 2020-07-28 DIAGNOSIS — L578 Other skin changes due to chronic exposure to nonionizing radiation: Secondary | ICD-10-CM

## 2020-07-28 DIAGNOSIS — Z1283 Encounter for screening for malignant neoplasm of skin: Secondary | ICD-10-CM | POA: Diagnosis not present

## 2020-07-28 DIAGNOSIS — D485 Neoplasm of uncertain behavior of skin: Secondary | ICD-10-CM

## 2020-07-28 DIAGNOSIS — D2261 Melanocytic nevi of right upper limb, including shoulder: Secondary | ICD-10-CM

## 2020-07-28 DIAGNOSIS — D225 Melanocytic nevi of trunk: Secondary | ICD-10-CM | POA: Diagnosis not present

## 2020-07-28 DIAGNOSIS — D229 Melanocytic nevi, unspecified: Secondary | ICD-10-CM | POA: Diagnosis not present

## 2020-07-28 DIAGNOSIS — L821 Other seborrheic keratosis: Secondary | ICD-10-CM

## 2020-07-28 DIAGNOSIS — B079 Viral wart, unspecified: Secondary | ICD-10-CM | POA: Diagnosis not present

## 2020-07-28 DIAGNOSIS — L811 Chloasma: Secondary | ICD-10-CM | POA: Diagnosis not present

## 2020-07-28 DIAGNOSIS — D1800 Hemangioma unspecified site: Secondary | ICD-10-CM

## 2020-07-28 DIAGNOSIS — D239 Other benign neoplasm of skin, unspecified: Secondary | ICD-10-CM

## 2020-07-28 DIAGNOSIS — L814 Other melanin hyperpigmentation: Secondary | ICD-10-CM

## 2020-07-28 HISTORY — DX: Other benign neoplasm of skin, unspecified: D23.9

## 2020-07-28 NOTE — Patient Instructions (Addendum)
Recommend taking Heliocare sun protection supplement daily in sunny weather for additional sun protection. For maximum protection on the sunniest days, you can take up to 2 capsules of regular Heliocare OR take 1 capsule of Heliocare Ultra. For prolonged exposure (such as a full day in the sun), you can repeat your dose of the supplement 4 hours after your first dose. Heliocare can be purchased at [facility] or at [email or web address].   Melanoma ABCDEs  Melanoma is the most dangerous type of skin cancer, and is the leading cause of death from skin disease.  You are more likely to develop melanoma if you:  Have light-colored skin, light-colored eyes, or red or blond hair  Spend a lot of time in the sun  Tan regularly, either outdoors or in a tanning bed  Have had blistering sunburns, especially during childhood  Have a close family member who has had a melanoma  Have atypical moles or large birthmarks  Early detection of melanoma is key since treatment is typically straightforward and cure rates are extremely high if we catch it early.   The first sign of melanoma is often a change in a mole or a new dark spot.  The ABCDE system is a way of remembering the signs of melanoma.  A for asymmetry:  The two halves do not match. B for border:  The edges of the growth are irregular. C for color:  A mixture of colors are present instead of an even brown color. D for diameter:  Melanomas are usually (but not always) greater than 3mm - the size of a pencil eraser. E for evolution:  The spot keeps changing in size, shape, and color.  Please check your skin once per month between visits. You can use a small mirror in front and a large mirror behind you to keep an eye on the back side or your body.   If you see any new or changing lesions before your next follow-up, please call to schedule a visit.  Please continue daily skin protection including broad spectrum sunscreen SPF 30+ to  sun-exposed areas, reapplying every 2 hours as needed when you're outdoors.    Wound Care Instructions  1. Cleanse wound gently with soap and water once a day then pat dry with clean gauze. Apply a thing coat of Petrolatum (petroleum jelly, "Vaseline") over the wound (unless you have an allergy to this). We recommend that you use a new, sterile tube of Vaseline. Do not pick or remove scabs. Do not remove the yellow or white "healing tissue" from the base of the wound.  2. Cover the wound with fresh, clean, nonstick gauze and secure with paper tape. You may use Band-Aids in place of gauze and tape if the would is small enough, but would recommend trimming much of the tape off as there is often too much. Sometimes Band-Aids can irritate the skin.  3. You should call the office for your biopsy report after 1 week if you have not already been contacted.  4. If you experience any problems, such as abnormal amounts of bleeding, swelling, significant bruising, significant pain, or evidence of infection, please call the office immediately.  5. FOR ADULT SURGERY PATIENTS: If you need something for pain relief you may take 1 extra strength Tylenol (acetaminophen) AND 2 Ibuprofen (200mg  each) together every 4 hours as needed for pain. (do not take these if you are allergic to them or if you have a reason you should not take them.)  Typically, you may only need pain medication for 1 to 3 days.

## 2020-07-28 NOTE — Progress Notes (Addendum)
New Patient Visit  Subjective  Jane Nichols is a 29 y.o. female who presents for the following: Annual Exam (Total body exam today. No hx of skin cancer or dysplastic nevi. Fhx of skin cancer, mother has had multiple skin cancers. Pt is unsure today if any were melanoma. ).  Pt has noticed a darkening to the skin above eyebrows and upper lip.   Objective  Well appearing patient in no apparent distress; mood and affect are within normal limits.  A full examination was performed including scalp, head, eyes, ears, nose, lips, neck, chest, axillae, abdomen, back, buttocks, bilateral upper extremities, bilateral lower extremities, hands, feet, fingers, toes, fingernails, and toenails. All findings within normal limits unless otherwise noted below.  Objective  right second finger over PIP joint: Verrucous papules  Objective  Head - Anterior (Face): Reticulated hyperpigmented patches at forehead and upper lip   Objective  Right Shoulder: 0.45 cm medium and dark brown papule  R/o atypia Shave removal       Objective  Left Mid Back: R/o atypia 0.8 cm medium brown papule with dark focus Shave removal     Objective  Mid Back: Mid back right of midline 0.5 cm medium to dark brown thin papule  Images    Assessment & Plan  Viral warts, unspecified type right second finger over PIP joint  Discussed viral etiology and risk of spread.  Discussed multiple treatments may be required to clear warts.  Discussed possible post-treatment dyspigmentation and risk of recurrence.  Discussed treatment options of freezing vs cantharidin in office vs wartpeel at home.   WartPEEL is a special compounded medicine (5-fluorouracil/salicylic acid) used to treat warts. It works much faster than over the counter salicylic acid but is not paid for by insurance. It should be applied to the wart once a day and covered with a bandage. Care should be taken to avoid applying it to the normal  skin surrounding the wart in order to avoid irritation. It should not be used by pregnant women.   Pt defers prescription or in-office treatment today.   Melasma Head - Anterior (Face)  Chronic, currently flared.  Discussed chronic condition, worse in summer due to higher UV exposure.  Oral estrogen containing BCPs or supplements can exacerbate condition.  Recommend daily broad spectrum tinted sunscreen SPF 30+ to face, preferably with Zinc or Titanium Dioxide.   Discussed Rx topical bleaching creams (i.e. hydroquinone), OTC HelioCare supplement, chemical peels (would need multiple for best result).   Start Skin Medicinals Hydroquinone 12%/kojic acid/vitamin C cream pea sized amount twice daily to the entire face for up to 3 months. This cannot be used more than 3 months due to risk of exogenous ochronosis (permanent dark spots). The patient was advised this is not covered by insurance. They will receive an email to check out and the medication will be mailed to their home.    Neoplasm of uncertain behavior of skin (2) Right Shoulder  Epidermal / dermal shaving  Lesion diameter (cm):  0.5 Informed consent: discussed and consent obtained   Timeout: patient name, date of birth, surgical site, and procedure verified   Procedure prep:  Patient was prepped and draped in usual sterile fashion Prep type:  Isopropyl alcohol Anesthesia: the lesion was anesthetized in a standard fashion   Anesthetic:  1% lidocaine w/ epinephrine 1-100,000 buffered w/ 8.4% NaHCO3 Instrument used: flexible razor blade   Hemostasis achieved with: pressure, aluminum chloride and electrodesiccation   Outcome: patient tolerated procedure  well   Post-procedure details: sterile dressing applied and wound care instructions given   Dressing type: bandage and petrolatum    Specimen 2 - Surgical pathology Differential Diagnosis: R/o atypia Check Margins: No 0.45 cm medium and dark brown papule     Left Mid  Back  Epidermal / dermal shaving  Lesion diameter (cm):  0.8 Informed consent: discussed and consent obtained   Timeout: patient name, date of birth, surgical site, and procedure verified   Procedure prep:  Patient was prepped and draped in usual sterile fashion Prep type:  Isopropyl alcohol Anesthesia: the lesion was anesthetized in a standard fashion   Anesthetic:  1% lidocaine w/ epinephrine 1-100,000 buffered w/ 8.4% NaHCO3 Instrument used: flexible razor blade   Hemostasis achieved with: pressure, aluminum chloride and electrodesiccation   Outcome: patient tolerated procedure well   Post-procedure details: sterile dressing applied and wound care instructions given   Dressing type: bandage and petrolatum    Specimen 1 - Surgical pathology Differential Diagnosis: R/o atypia Check Margins: No 0.8 cm medium brown papule with dark focus   Nevus Mid Back  Recheck on f/u  Consider biopsy if other site show severe atypia   Lentigines - Scattered tan macules - Discussed due to sun exposure - Benign, observe - Call for any changes  Seborrheic Keratoses - Stuck-on, waxy, tan-brown papules and plaques  - Discussed benign etiology and prognosis. - Observe - Call for any changes  Melanocytic Nevi - Tan-brown and/or pink-flesh-colored symmetric macules and papules - Benign appearing on exam today - Observation - Call clinic for new or changing moles - Recommend daily use of broad spectrum spf 30+ sunscreen to sun-exposed areas.   Hemangiomas - Red papules - Discussed benign nature - Observe - Call for any changes  Actinic Damage - Chronic, secondary to cumulative UV/sun exposure - diffuse scaly erythematous macules with underlying dyspigmentation - Recommend daily broad spectrum sunscreen SPF 30+ to sun-exposed areas, reapply every 2 hours as needed.  - Call for new or changing lesions.  Skin cancer screening performed today.  Return in about 3 months (around  10/28/2020) for recheck moles.   I, Harriett Sine, CMA, am acting as scribe for Forest Gleason, MD.   Documentation: I have reviewed the above documentation for accuracy and completeness, and I agree with the above.  Forest Gleason, MD

## 2020-08-02 ENCOUNTER — Encounter: Payer: Self-pay | Admitting: Dermatology

## 2020-08-02 ENCOUNTER — Telehealth: Payer: Self-pay

## 2020-08-02 NOTE — Telephone Encounter (Signed)
That's totally fine. I'm happy to refer per their preference.   MAs please send referral to skin surgery center for excision and also please call her and schedule a sooner follow-up to recheck and consider biopsy of the other mole on her back. We had photographed and planned to biopsy if her other nevi showed severe atypia.  She is currently scheduled for February but I would prefer to recheck and possibly biopsy this site in the next month or so. OK to add as a 12:30 if we don't have spots open. Thank you

## 2020-08-02 NOTE — Progress Notes (Signed)
1. Skin , left mid back, shave removal DYSPLASTIC COMPOUND NEVUS WITH MODERATE ATYPIA, LIMITED MARGINS FREE  This is a MODERATELY ATYPICAL MOLE. On the spectrum from normal mole to melanoma skin cancer, this is in between the two. - We need to recheck this area sometime in the next 6 months to be sure there is no evidence of the atypical mole coming back. If there is any color coming back, we would recommend repeating the biopsy to be sure the cells look normal.  - People who have a history of atypical moles do have a slightly increased risk of developing melanoma somewhere on the body, so a yearly full body skin exam by a dermatologist is recommended.  - Monthly self skin checks and daily sun protection are also recommended.  - Please call if you notice a dark spot coming back where this biopsy was taken.  - Please also call if you notice any new or changing spots anywhere else on the body before your follow-up visit.    2. Skin , right shoulder, shave removal DYSPLASTIC COMPOUND NEVUS WITH SEVERE ATYPIA, IRRITATED, CLOSE TO MARGIN, SEE DESCRIPTION --> Excision  Dr. Jerilynn Mages discussed with pt 08/02/2020 at 2 pm  MAs please call  to schedule excision. Thank you

## 2020-08-02 NOTE — Telephone Encounter (Signed)
Patient and mother called together this afternoon to discuss biopsy results together. Went over everything per Dr. Laurence Ferrari again.  Mom and patient would like for patient to go [facility] for excision. Advised we could do surgery here but mom still requested for patient to go to [facility].  Wanted to make you aware before doing referral.

## 2020-08-03 ENCOUNTER — Telehealth: Payer: Self-pay | Admitting: Dermatology

## 2020-08-03 NOTE — Telephone Encounter (Signed)
Called patient to ensure she did not have any other questions or concerns after discussion about the diagnosis and treatment recommendation yesterday.  I also wanted to schedule her to recheck and possibly biopsy the remaining mole that we identified on her exam.  Will also advise that I am happy to refer her to the skin surgery center for excision per her preference.  She advised that she is in the middle of something at work today and asked that I call back later.  I will try her back tomorrow.

## 2020-08-04 NOTE — Telephone Encounter (Signed)
I spoke with Jane Nichols, She says neither she nor her mom have any additional questions or concerns. She notes that she thought she understood when we spoke on the phone but when her mother started asking her questions she wasn't able to explain it well to her mother and wasn't sure she fully understood so they called back to speak with [place]. She says that Jane Nichols was able to answer all of their questions.   Her mother prefers her to be seen at [facility] since that's where she goes and is comfortable with. Per patient preference, we will refer for excision for her severely atypical nevus at [facility] and they will also take over her general dermatology care.   She does have a spot (0.5 cm medium to dark brown thin papule) at the mid back right of midline identified at the last exam which we had planned to biopsy if either of her biopsies performed that day showed severe atypia.   Since the right shoulder did show a severely atypical mole, I would recommend that the mole at the mid back right of midline be rechecked in the next 1-2 months and possibly biopsied. I discussed this with Jane Nichols who would prefer to have this checked at the same time as her surgery if possible to minimize time away from work.   I advised that she will have to check with the [facility] if they can check it on the same day since she will be going there for surgery. There is a photo in our note where it is circled with dots, and we will send over our records so they can see the photo and exam findings.   I again reviewed that she should be diligent with sun protection, perform self skin exams at home between visits, and have a full skin exam performed by a dermatologist at least once a year in addition to the surgery and recheck of the sites at her back.   All questions answered.  MAs can you please refer to [facility] for excision (with a note to take over her general dermatology care  as well) and include a copy of our clinic note and phone messages? Thank you!

## 2020-08-05 ENCOUNTER — Ambulatory Visit: Payer: 59 | Admitting: Family Medicine

## 2020-08-09 ENCOUNTER — Other Ambulatory Visit: Payer: Self-pay

## 2020-08-09 DIAGNOSIS — D239 Other benign neoplasm of skin, unspecified: Secondary | ICD-10-CM

## 2020-08-09 NOTE — Telephone Encounter (Signed)
Referral placed and all notes sent.

## 2020-08-09 NOTE — Progress Notes (Signed)
Pt requested referral to [facility] for excision of severe dysplastic nevus and transition of care.

## 2020-08-24 ENCOUNTER — Encounter: Payer: Self-pay | Admitting: Gastroenterology

## 2020-08-24 ENCOUNTER — Ambulatory Visit (INDEPENDENT_AMBULATORY_CARE_PROVIDER_SITE_OTHER): Payer: 59 | Admitting: Gastroenterology

## 2020-08-24 VITALS — BP 116/82 | HR 93 | Temp 98.1°F | Ht 64.0 in | Wt 153.1 lb

## 2020-08-24 DIAGNOSIS — R1013 Epigastric pain: Secondary | ICD-10-CM | POA: Diagnosis not present

## 2020-08-24 NOTE — Progress Notes (Signed)
Cephas Darby, MD [address]  [place]  [place], [place] [address]  Main: [phone]  Fax: [phone]    Gastroenterology Consultation  Referring Provider:     Delsa Grana, PA-C Primary Care Physician:  Delsa Grana, PA-C Primary Gastroenterologist:  Dr. Cephas Darby Reason for Consultation:     Dyspepsia        HPI:   Jane Nichols is a 29 y.o. female referred by Dr. Delsa Grana, PA-C  for consultation & management of more than 2 months history of upper abdominal discomfort associated with abdominal bloating, fullness of stomach.  She also had severe constipation, which has resolved with dietary modification.  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  She has recently graduated from [employer] and due to her work, she is not able to focus on healthy eating habits and her physical activity.  She tends to eat out regularly.  She does vape as well as drink sodas regularly.  She was briefly on Pepcid which temporarily helped.  Patient stopped Pepcid as her symptoms have somewhat improved.  Labs including CBC, CMP, TSH are unremarkable  NSAIDs: None  Antiplts/Anticoagulants/Anti thrombotics: None  GI Procedures: None  Past Medical History:  Diagnosis Date  . History of insomnia   . Other viral warts    hands    Past Surgical History:  Procedure Laterality Date  . FOOT SURGERY    . TOE SURGERY Right 2006   bone spur  . WISDOM TOOTH EXTRACTION  2012   No current outpatient medications on file.   Family History  Problem Relation Age of Onset  . Bipolar disorder Mother   . Diabetes Paternal Grandfather      Social History   Tobacco Use  . Smoking status: Current Every Day Smoker    Packs/day: 0.50    Years: 4.00    Pack years: 2.00  . Smokeless tobacco: Never Used  Vaping Use  . Vaping Use: Every day  Substance Use Topics  . Alcohol use: Yes    Alcohol/week: 1.0 standard drink    Types: 1 Cans of beer per week   . Drug use: No    Allergies as of 08/24/2020  . (No Known Allergies)    Review of Systems:    All systems reviewed and negative except where noted in HPI.   Physical Exam:  BP 116/82 (BP Location: Left Arm, Patient Position: Sitting, Cuff Size: Normal)   Pulse 93   Temp 98.1 F (36.7 C) (Oral)   Ht 5\' 4"  (1.626 m)   Wt 153 lb 2 oz (69.5 kg)   BMI 26.28 kg/m  No LMP recorded.  General:   Alert,  Well-developed, well-nourished, pleasant and cooperative in NAD Head:  Normocephalic and atraumatic. Eyes:  Sclera clear, no icterus.   Conjunctiva pink. Ears:  Normal auditory acuity. Nose:  No deformity, discharge, or lesions. Mouth:  No deformity or lesions,oropharynx pink & moist. Neck:  Supple; no masses or thyromegaly. Lungs:  Respirations even and unlabored.  Clear throughout to auscultation.   No wheezes, crackles, or rhonchi. No acute distress. Heart:  Regular rate and rhythm; no murmurs, clicks, rubs, or gallops. Abdomen:  Normal bowel sounds. Soft, mild epigastric tenderness and non-distended without masses, hepatosplenomegaly or hernias noted.  No guarding or rebound tenderness.   Rectal: Not performed Msk:  Symmetrical without gross deformities. Good, equal movement & strength bilaterally. Pulses:  Normal pulses noted. Extremities:  No clubbing or edema.  No cyanosis. Neurologic:  Alert and oriented x3;  grossly normal neurologically. Skin:  Intact without significant lesions or rashes. No jaundice. Psych:  Alert and cooperative. Normal mood and affect.  Imaging Studies: Reviewed  Assessment and Plan:   Jane Nichols is a 29 y.o. pleasant Caucasian female with no significant past medical history is seen in consultation for approximately 2 months history of symptoms of dyspepsia.  She did have constipation that was temporary  Dyspepsia Recommend H. pylori breath test Trial of FD guard, samples provided Highly encouraged to avoid carbonated beverages, try to  incorporate physical activity   Follow up as needed   Cephas Darby, MD

## 2020-08-26 LAB — H. PYLORI BREATH TEST: H pylori Breath Test: NEGATIVE

## 2020-09-01 DIAGNOSIS — Z86018 Personal history of other benign neoplasm: Secondary | ICD-10-CM

## 2020-09-01 HISTORY — DX: Personal history of other benign neoplasm: Z86.018

## 2020-09-14 NOTE — Telephone Encounter (Signed)
Opened in error

## 2020-11-02 ENCOUNTER — Ambulatory Visit: Payer: 59 | Admitting: Dermatology

## 2020-11-24 ENCOUNTER — Ambulatory Visit: Payer: 59 | Admitting: Dermatology

## 2021-01-18 ENCOUNTER — Telehealth: Payer: Self-pay

## 2021-01-18 NOTE — Telephone Encounter (Signed)
[place] and confirmed patient had excision of severely atypical mole excised at right shoulder 09/01/20, JS

## 2021-02-02 ENCOUNTER — Ambulatory Visit: Payer: 59 | Admitting: Family Medicine

## 2021-06-02 ENCOUNTER — Other Ambulatory Visit: Payer: Self-pay

## 2021-06-02 ENCOUNTER — Other Ambulatory Visit (HOSPITAL_BASED_OUTPATIENT_CLINIC_OR_DEPARTMENT_OTHER): Payer: Self-pay

## 2021-06-02 ENCOUNTER — Encounter: Payer: Self-pay | Admitting: Family Medicine

## 2021-06-02 ENCOUNTER — Ambulatory Visit (INDEPENDENT_AMBULATORY_CARE_PROVIDER_SITE_OTHER): Admitting: Family Medicine

## 2021-06-02 VITALS — BP 118/78 | HR 98 | Temp 98.0°F | Resp 16 | Ht 64.0 in | Wt 151.6 lb

## 2021-06-02 DIAGNOSIS — M545 Low back pain, unspecified: Secondary | ICD-10-CM

## 2021-06-02 DIAGNOSIS — G4700 Insomnia, unspecified: Secondary | ICD-10-CM

## 2021-06-02 DIAGNOSIS — M5412 Radiculopathy, cervical region: Secondary | ICD-10-CM

## 2021-06-02 DIAGNOSIS — F4323 Adjustment disorder with mixed anxiety and depressed mood: Secondary | ICD-10-CM

## 2021-06-02 DIAGNOSIS — F411 Generalized anxiety disorder: Secondary | ICD-10-CM

## 2021-06-02 MED ORDER — BACLOFEN 10 MG PO TABS: 5.0000 mg | ORAL_TABLET | Freq: Three times a day (TID) | ORAL | 1 refills | Status: DC | PRN

## 2021-06-02 MED ORDER — BUSPIRONE HCL 5 MG PO TABS: 5.0000 mg | ORAL_TABLET | Freq: Three times a day (TID) | ORAL | 1 refills | Status: DC | PRN

## 2021-06-02 MED ORDER — BACLOFEN 10 MG PO TABS
5.0000 mg | ORAL_TABLET | Freq: Three times a day (TID) | ORAL | 1 refills | Status: DC | PRN
  Filled 2021-06-02: qty 60, 20d supply, fill #0

## 2021-06-02 MED ORDER — TRAZODONE HCL 50 MG PO TABS: 50.0000 mg | ORAL_TABLET | Freq: Every evening | ORAL | 3 refills | Status: DC | PRN

## 2021-06-02 NOTE — Progress Notes (Signed)
Patient ID: Jane Nichols, female    DOB: 04-Jun-1991, 30 y.o.   MRN: [id number]  PCP: Delsa Grana, PA-C  Chief Complaint  Patient presents with   Back Pain    Upper and lower back due to work with a lot of movement and twisting.  She describes as sharp shooting pains with stiffness    Subjective:   Jane Nichols is a 30 y.o. female, presents to clinic with CC of the following:  HPI   Upper and lower back pain x 6 weeks, change in physical activity - working on a farm heavy lifting and twisting, sometimes pain radiates to b/l arms and feels numb, in low back its sore and achy and tight all the time, she has tried NSAIDs, tylenol, her massage gun, rest, stretching, no improvement. No abd pain, numbness/weakness to legs She is healthy, works out and lifts a lot, so she's surprised that job change with some more physical activity would cause her this much pain.  Hx of intermittent back pain flares, this seems worse than in the past   Mood/anxiety depression - a lot going on in her life and she feels anxious, irritated and depressed.  She has sx of similar but usually copes with it on her own, however now feels more severe sx and she would like to try meds.  Previously was on trazodone for sleep and when she ran out of meds about 2 weeks ago her moods throughout the day seem to be worse off meds.  She is tired, doesn't feel like doing things she normally would enjoy, shes irritable, worrying a lot. Depression screen [facility] 2/9 06/02/2021 06/02/2020 03/03/2020  Decreased Interest 2 0 0  Down, Depressed, Hopeless 2 0 0  PHQ - 2 Score 4 0 0  Altered sleeping 1 0 0  Tired, decreased energy 2 0 1  Change in appetite 1 0 0  Feeling bad or failure about yourself  1 0 0  Trouble concentrating 2 0 1  Moving slowly or fidgety/restless 1 0 0  Suicidal thoughts 0 0 0  PHQ-9 Score 12 0 2  Difficult doing work/chores Very difficult Not difficult at all Not difficult at all   GAD 7 : Generalized  Anxiety Score 06/02/2021  Nervous, Anxious, on Edge 2  Control/stop worrying 2  Worry too much - different things 2  Trouble relaxing 2  Restless 1  Easily annoyed or irritable 3  Afraid - awful might happen 1  Total GAD 7 Score 13  Anxiety Difficulty Very difficult   Pt was talking to a therapist but with job changes it got too expensive and she can no longer affort it Never on meds before Her mother has hx of anxiety and bipolar MDQ questionnaire filled out today - negative screening        Patient Active Problem List   Diagnosis Date Noted   Insomnia 06/02/2021   Current smoker 06/02/2020   Hyperpigmentation of skin 06/02/2020   GAD (generalized anxiety disorder) 06/02/2020     No current outpatient medications on file.   No Known Allergies   Social History   Tobacco Use   Smoking status: Every Day    Packs/day: 0.50    Years: 4.00    Pack years: 2.00    Types: Cigarettes   Smokeless tobacco: Never  Vaping Use   Vaping Use: Every day  Substance Use Topics   Alcohol use: Yes    Alcohol/week: 1.0 standard drink  Types: 1 Cans of beer per week   Drug use: No      Chart Review Today: I personally reviewed active problem list, medication list, allergies, family history, social history, health maintenance, notes from last encounter, lab results, imaging with the patient/caregiver today.   Review of Systems  Constitutional: Negative.   HENT: Negative.    Eyes: Negative.   Respiratory: Negative.    Cardiovascular: Negative.   Gastrointestinal: Negative.   Endocrine: Negative.   Genitourinary: Negative.   Musculoskeletal: Negative.   Skin: Negative.   Allergic/Immunologic: Negative.   Neurological: Negative.   Hematological: Negative.   Psychiatric/Behavioral: Negative.    All other systems reviewed and are negative.     Objective:   Vitals:   06/02/21 1346  BP: 118/78  Pulse: 98  Resp: 16  Temp: 98 F (36.7 C)  SpO2: 99%  Weight: 151  lb 9.6 oz (68.8 kg)  Height: '5\' 4"'$  (1.626 m)    Body mass index is 26.02 kg/m.  Physical Exam Vitals and nursing note reviewed.  Constitutional:      General: She is not in acute distress.    Appearance: Normal appearance. She is well-developed and normal weight. She is not ill-appearing, toxic-appearing or diaphoretic.  HENT:     Head: Normocephalic and atraumatic.     Nose: Nose normal.  Eyes:     General:        Right eye: No discharge.        Left eye: No discharge.     Conjunctiva/sclera: Conjunctivae normal.  Neck:     Trachea: Trachea and phonation normal. No tracheal deviation.  Cardiovascular:     Rate and Rhythm: Normal rate and regular rhythm.  Pulmonary:     Effort: Pulmonary effort is normal. No respiratory distress.     Breath sounds: No stridor.  Musculoskeletal:     Cervical back: Normal range of motion and neck supple. No rigidity, spasms, tenderness or bony tenderness. No pain with movement, spinous process tenderness or muscular tenderness.     Thoracic back: Normal.     Lumbar back: Normal. No swelling, edema, spasms, tenderness or bony tenderness. Normal range of motion. Negative right straight leg raise test and negative left straight leg raise test.     Right lower leg: No edema.     Left lower leg: No edema.     Comments: 5/5 bilateral dorsiflexion, plantar flexion and grip strength Grossly normal sensation in all extremities to light touch Normal gait and able to sit in exam room and transition from chair to standing to exam table and back down and ambulate in clinic without observed abnormality or appearance of pain  Skin:    General: Skin is warm and dry.     Findings: No rash.  Neurological:     Mental Status: She is alert.     Motor: No abnormal muscle tone.     Coordination: Coordination normal.  Psychiatric:        Attention and Perception: Attention normal.        Mood and Affect: Affect normal. Mood is anxious. Mood is not depressed or  elated. Affect is not labile, blunt, flat, angry, tearful or inappropriate.        Speech: Speech normal.        Behavior: Behavior normal. Behavior is cooperative.        Thought Content: Thought content normal. Thought content does not include homicidal or suicidal ideation. Thought content does not include homicidal  or suicidal plan.     Results for orders placed or performed in visit on 08/24/20  H. pylori breath test  Result Value Ref Range   H pylori Breath Test Negative Negative       Assessment & Plan:     ICD-10-CM   1. Cervical radiculopathy  M54.12 baclofen (LIORESAL) 10 MG tablet    DG Cervical Spine Complete    Ambulatory referral to Physical Therapy   a lot of lifting and physical work with job change, pain in neck, upper back and intermittently radiating down arms x 6 weeks, screening xr and PT eval    2. Acute bilateral low back pain without sciatica  M54.50 baclofen (LIORESAL) 10 MG tablet    DG Lumbar Spine Complete    Ambulatory referral to Physical Therapy   x6 weeks, neurovascularly intact, back across low back, a lot of heavy lifting and twisting, screening xr and PT eval and tx    3. Insomnia, unspecified type  G47.00 traZODone (DESYREL) 50 MG tablet   can restart trazodone - did help sleep and mood overall, she has been on for years prescribed previously by the [employer], worse mood when out of meds    4. Adjustment reaction with anxiety and depression  F43.23 traZODone (DESYREL) 50 MG tablet    busPIRone (BUSPAR) 5 MG tablet    AMB Referral to [facility]     We discussed multiple other meds options - pt wanted to first try prn med - also discussed lexapro, prozac, celexa as options - she wanted to talk to her mom about her meds and hx She noticed a big difference with running out of trazodone so she hopes that will improve mood Family hx of bipolar - MDQ negative, no reported manic sx, she appears more anxious and now mood is down with a lot  of changes and events recently, DUI, loss of job and other things that she did not divulge today. Hopefully buspar will be effective, if not she may try daily med, therapy recommended We did review how if she happens to be bipolar the SSRIs can have mania SE - and that would be something to watch for.    Close f/up 4-6 weeks      Delsa Grana, PA-C 06/02/21 2:14 PM

## 2021-06-05 ENCOUNTER — Telehealth: Payer: Self-pay | Admitting: *Deleted

## 2021-06-05 NOTE — Chronic Care Management (AMB) (Signed)
  Care Management   Note  06/05/2021 Name: Jane Nichols MRN: [id number] DOB: April 03, 1991  Jane Nichols is a 30 y.o. year old female who is a primary care patient of Delsa Grana, [place]. I reached out to Jane Nichols by phone today in response to a referral sent by Ms. Fidela Juneau [facility] health plan.    Ms. Franklyn was given information about care management services today including:  Care management services include personalized support from designated clinical staff supervised by her physician, including individualized plan of care and coordination with other care providers 24/7 contact phone numbers for assistance for urgent and routine care needs. The patient may stop care management services at any time by phone call to the office staff.  Patient agreed to services and verbal consent obtained.   Follow up plan: Telephone appointment with care management team member scheduled for: 06/19/2021  Julian Hy, [place] Management  Direct Dial: [phone]

## 2021-06-09 ENCOUNTER — Other Ambulatory Visit (HOSPITAL_BASED_OUTPATIENT_CLINIC_OR_DEPARTMENT_OTHER): Payer: Self-pay

## 2021-06-19 ENCOUNTER — Telehealth: Admitting: *Deleted

## 2021-06-19 ENCOUNTER — Telehealth: Payer: Self-pay | Admitting: *Deleted

## 2021-06-19 NOTE — Telephone Encounter (Signed)
  Care Management   Follow Up Note   06/19/2021 Name: Jane Nichols MRN: [id number] DOB: Sep 03, 1991   Referred by: Delsa Grana, PA-C Reason for referral : Care Coordination   An unsuccessful telephone outreach was attempted today. The patient was referred to the case management team for assistance with care management and care coordination.   Follow Up Plan: Telephone follow up appointment with care management team member to be re-scheduled by [place], [place] Worker  [facility] [phone]

## 2021-06-19 NOTE — Telephone Encounter (Signed)
   06/19/2021  Jane Nichols Apr 20, 1991 [id number]  Phone call to patient to complete initial intake however patient stated that she has started a new job and could not keep scheduled appointment. Patient would like a call back between 4:30-5:00 to re-schedule.    Elliot Gurney, [place] Administrator, [facility] [phone]

## 2021-06-20 ENCOUNTER — Telehealth: Payer: Self-pay | Admitting: *Deleted

## 2021-06-20 NOTE — Chronic Care Management (AMB) (Signed)
  Care Management   Note  06/20/2021 Name: KAMARIYAH TIMBERLAKE MRN: [id number] DOB: [birth year]  [name] is a 30 y.o. year old female who is a primary care patient of Laurell Roof and is actively engaged with the care management team. I reached out to [name] by phone today to assist with re-scheduling an initial visit with the Licensed Clinical Social Worker  Follow up plan: Unsuccessful telephone outreach attempt made. A HIPAA compliant phone message was left for the patient providing contact information and requesting a return call.   Julian Hy, [place] Management  Direct Dial: [phone]

## 2021-06-23 ENCOUNTER — Telehealth: Payer: Self-pay | Admitting: *Deleted

## 2021-06-23 NOTE — Telephone Encounter (Signed)
   06/23/2021  Jane Nichols [birth year] [id number]   Phone call from patient to re-schedule initial intake appointment. Appointment scheduled for 06/26/21 at 3:30pm.  Elliot Gurney, [place] Worker  [facility] [phone]

## 2021-06-23 NOTE — Chronic Care Management (AMB) (Signed)
  Care Management   Note  Per phone note from provider, pt rescheduled for 06/26/2021

## 2021-06-26 ENCOUNTER — Ambulatory Visit: Admitting: *Deleted

## 2021-06-26 DIAGNOSIS — F4323 Adjustment disorder with mixed anxiety and depressed mood: Secondary | ICD-10-CM

## 2021-06-27 NOTE — Chronic Care Management (AMB) (Signed)
Care Management Clinical Social Work Note  06/27/2021 Name: Jane Nichols MRN: [id number] DOB: 05/01/1991  Jane Nichols is a 30 y.o. year old female who is a primary care patient of Delsa Grana, [place].  The Care Management team was consulted for assistance with chronic disease management and coordination needs.  Engaged with patient by telephone for initial visit in response to provider referral for social work chronic care management and care coordination services  Consent to Services:  [name] was given information about Care Management services today including:  Care Management services includes personalized support from designated clinical staff supervised by her physician, including individualized plan of care and coordination with other care providers 24/7 contact phone numbers for assistance for urgent and routine care needs. The patient may stop case management services at any time by phone call to the office staff.  Patient agreed to services and consent obtained.   Assessment: Review of patient past medical history, allergies, medications, and health status, including review of relevant consultants reports was performed today as part of a comprehensive evaluation and provision of chronic care management and care coordination services.  SDOH (Social Determinants of Health) assessments and interventions performed:  SDOH Interventions    Flowsheet Row Most Recent Value  SDOH Interventions   SDOH Interventions for the Following Domains Depression  Depression Interventions/Treatment  Counseling        Advanced Directives Status: Not addressed in this encounter.  Care Plan  No Known Allergies  Outpatient Encounter Medications as of 06/26/2021  Medication Sig   baclofen (LIORESAL) 10 MG tablet Take 1/2-1 tablet (5-10 mg total) by mouth 3 (three) times daily as needed for muscle spasms (or tightness).   busPIRone (BUSPAR) 5 MG tablet Take 1-4 tablets (5-20 mg total) by  mouth 3 (three) times daily as needed.   traZODone (DESYREL) 50 MG tablet Take 1 tablet (50 mg total) by mouth at bedtime as needed for sleep.   No facility-administered encounter medications on file as of 06/26/2021.    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/02/2021   Current smoker 06/02/2020   Hyperpigmentation of skin 06/02/2020    Conditions to be addressed/monitored: Anxiety and Depression; Mental Health Concerns   Care Plan : Depression (Adult)  Updates made by Vern Claude, LCSW since 06/27/2021 12:00 AM     Problem: Symptoms (Depression)      Long-Range Goal: Symptoms Monitored and Managed   Start Date: 06/26/2021  Expected End Date: 12/25/2021  Priority: High  Note:   Current Barriers:  Chronic Mental Health needs related to depression and anxiety Mental Health Concerns  Suicidal Ideation/Homicidal Ideation: No  Clinical Social Work Goal(s):  Over the next 90 days, patient will work with SW bi-weekly by telephone or in person to reduce or manage symptoms related to anxiety and depression explore community resource options for unmet needs related to:  Depression    Interventions: Patient interviewed and appropriate assessments performed: PHQ 2 PHQ 9 SDOH Interventions    Flowsheet Row Most Recent Value  SDOH Interventions   SDOH Interventions for the Following Domains Depression  Depression Interventions/Treatment  Counseling     Confirmed symptoms of anxiety, depression , conflicts with family Coping strategies explored-daily schedule, employment, change in weather has been helpful  Patient confirmed history of mental health counseling-ended due to finances-now working and agreeable to mental health follow up Completed referral for ongoing mental  health counseling using [facility] Discussed referral to [facility] to assist with connecting to mental health  provider Reflective listening /Emotional support provided  Use of positive coping strategies reinforced  Patient Self Care Activities:  Performs ADL's independently Performs IADL's independently Ability for insight Motivation for treatment  Patient Coping Strengths:  Hopefulness Self Advocate Able to Communicate Effectively  Patient Self Care Deficits:   Initial goal documentation           Follow Up Plan: SW will follow up with patient by phone over the next 14 business days  [place], [place] Worker  [facility] [phone]

## 2021-06-27 NOTE — Chronic Care Management (AMB) (Deleted)
Chronic Care Management    Clinical Social Work Note  06/27/2021 Name: Jane Nichols MRN: [id number] DOB: 22-Nov-1990  Jane Nichols is a 30 y.o. year old female who is a primary care patient of Delsa Grana, [place]. The CCM team was consulted to assist the patient with chronic disease management and/or care coordination needs related to: {CCM SW CONSULT REASONS:25078}.   {CCMTELEPHONEFACETOFACE:21091510} for {CCMINITIALFOLLOWUPCHOICE:21091511} in response to provider referral for social work chronic care management and care coordination services.   Consent to Services:  {CCMCONSENTOPTIONS:25074}  Patient agreed to services and consent obtained.   Assessment: Review of patient past medical history, allergies, medications, and health status, including review of relevant consultants reports was performed today as part of a comprehensive evaluation and provision of chronic care management and care coordination services.     SDOH (Social Determinants of Health) assessments and interventions performed:  SDOH Interventions    Flowsheet Row Most Recent Value  SDOH Interventions   SDOH Interventions for the Following Domains Depression  Depression Interventions/Treatment  Counseling        Advanced Directives Status: {Advanced Directives Status:25048}  CCM Care Plan  No Known Allergies  Outpatient Encounter Medications as of 06/26/2021  Medication Sig   baclofen (LIORESAL) 10 MG tablet Take 1/2-1 tablet (5-10 mg total) by mouth 3 (three) times daily as needed for muscle spasms (or tightness).   busPIRone (BUSPAR) 5 MG tablet Take 1-4 tablets (5-20 mg total) by mouth 3 (three) times daily as needed.   traZODone (DESYREL) 50 MG tablet Take 1 tablet (50 mg total) by mouth at bedtime as needed for sleep.   No facility-administered encounter medications on file as of 06/26/2021.    Patient Active Problem List   Diagnosis Date Noted   Insomnia 06/02/2021   Adjustment reaction with  anxiety and depression 06/02/2021   Current smoker 06/02/2020   Hyperpigmentation of skin 06/02/2020    Conditions to be addressed/monitored: {CCM ASSESSMENT DZ OPTIONS:25047}; {CCM SW BARRIERS:25076}  Care Plan : Depression (Adult)  Updates made by Vern Claude, LCSW since 06/27/2021 12:00 AM     Problem: Symptoms (Depression)      Long-Range Goal: Symptoms Monitored and Managed   Start Date: 06/26/2021  Expected End Date: 12/25/2021  Priority: High  Note:   Current Barriers:  Chronic Mental Health needs related to depression and anxiety Mental Health Concerns  Suicidal Ideation/Homicidal Ideation: No  Clinical Social Work Goal(s):  Over the next 90 days, patient will work with SW bi-weekly by telephone or in person to reduce or manage symptoms related to anxiety and depression explore community resource options for unmet needs related to:  Depression    Interventions: Patient interviewed and appropriate assessments performed: PHQ 2 PHQ 9 SDOH Interventions    Flowsheet Row Most Recent Value  SDOH Interventions   SDOH Interventions for the Following Domains Depression  Depression Interventions/Treatment  Counseling     Confirmed symptoms of anxiety, depression , conflicts with family Coping strategies explored-daily schedule, employment, change in weather has been helpful  Patient confirmed history of mental health counseling-ended due to finances-now working and agreeable to mental health follow up Completed referral for ongoing mental health counseling using [facility] Discussed referral to [place] to assist with connecting to mental health provider Reflective listening /Emotional support provided  Use of positive coping strategies reinforced  Patient Self Care Activities:  Performs ADL's independently Performs IADL's independently Ability for insight Motivation for treatment  Patient Coping Strengths:  Hopefulness Self Advocate Able  to Communicate  Effectively  Patient Self Care Deficits:   Initial goal documentation           Follow Up Plan: {CCM SW FOLLOW UP YYQM:25003}      SIG***

## 2021-06-27 NOTE — Patient Instructions (Signed)
Visit Information  PATIENT GOALS:   Goals Addressed             This Visit's Progress    Begin and Stick with Counseling-Depression       Timeframe:  Long-Range Goal Priority:  High Start Date:     06/26/21                        Expected End Date:   12/25/21                    Follow Up Date 07/03/21    - check out counseling - keep 90 percent of counseling appointments - schedule counseling appointment    Why is this important?   Beating depression may take some time.  If you don't feel better right away, don't give up on your treatment plan.    Notes:         Jane Nichols was given information about Care Management services by the embedded care coordination team including:  Care Management services include personalized support from designated clinical staff supervised by her physician, including individualized plan of care and coordination with other care providers 24/7 contact phone numbers for assistance for urgent and routine care needs. The patient may stop CCM services at any time (effective at the end of the month) by phone call to the office staff.  Patient agreed to services and verbal consent obtained.   The patient verbalized understanding of instructions, educational materials, and care plan provided today and declined offer to receive copy of patient instructions, educational materials, and care plan.   Telephone follow up appointment with care management team member scheduled for: 07/03/21  Jane Nichols, [place] Worker  [facility] [phone]

## 2021-08-28 ENCOUNTER — Ambulatory Visit: Payer: Self-pay

## 2021-08-28 NOTE — Telephone Encounter (Signed)
Tried contacting pt, no answer. Need to schedule an appointment. The only one here right now taking her insurance is Dr Ancil Boozer and she is currently booked. Pt may need to go to urgent care

## 2021-08-28 NOTE — Telephone Encounter (Signed)
  Chief Complaint: mid back pain for 2 weeks and left arm tingling and numbness Symptoms: pt stated her arm feels like a pinched nerve after she shakes it it tingles Frequency: comes and goes Pertinent Negatives: Patient denies headache, abdominal pain Disposition: [] ED /[] Urgent Care (no appt availability in office) / [] Appointment(In office/virtual)/ []  [facility]/ [] Home Care/ [] Refused Recommended Disposition  Additional Notes: No available appts routing to office Orvis Brill- teams message sent and notified          Reason for Disposition  [1] MODERATE back pain (e.g., interferes with normal activities) AND [2] present > 3 days  Answer Assessment - Initial Assessment Questions 1. ONSET: "When did the pain begin?"      2 weeks ago  2. LOCATION: "Where does it hurt?" (upper, mid or lower back)     midback 3. SEVERITY: "How bad is the pain?"  (e.g., Scale 1-10; mild, moderate, or severe)   - MILD (1-3): doesn't interfere with normal activities    - MODERATE (4-7): interferes with normal activities or awakens from sleep    - SEVERE (8-10): excruciating pain, unable to do any normal activities      Moderate- feels sore when touching the area that hurts 4. PATTERN: "Is the pain constant?" (e.g., yes, no; constant, intermittent)      Comes and goes 5. RADIATION: "Does the pain shoot into your legs or elsewhere?"     Left arm tingling and numbness 6. 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 BACK OVERUSE:  "Any recent lifting of heavy objects, strenuous work or exercise?"     At work and fell on back 8. MEDICATIONS: "What have you taken so far for the pain?" (e.g., nothing, acetaminophen, NSAIDS)    Ibuprofen no relief from muscle relaxer 9. NEUROLOGIC SYMPTOMS: "Do you have any weakness, numbness, or problems with bowel/bladder control?"     Left arm numb feels like it is a pinched nerve now  tingling mid back 10. OTHER SYMPTOMS: "Do you have any other symptoms?" (e.g., fever, abdominal pain, burning with urination, blood in urine)       no 11. PREGNANCY: "Is there any chance you are pregnant?" (e.g., yes, no; LMP)       No/LMP last week  Protocols used: Back Pain-A-AH

## 2022-02-05 ENCOUNTER — Other Ambulatory Visit

## 2022-02-05 ENCOUNTER — Ambulatory Visit (INDEPENDENT_AMBULATORY_CARE_PROVIDER_SITE_OTHER)

## 2022-02-05 ENCOUNTER — Ambulatory Visit
Admission: EM | Admit: 2022-02-05 | Discharge: 2022-02-05 | Disposition: A | Discharge status: Home / Self Care | Attending: Internal Medicine | Admitting: Internal Medicine

## 2022-02-05 ENCOUNTER — Encounter: Payer: Self-pay | Admitting: Emergency Medicine

## 2022-02-05 DIAGNOSIS — S39012A Strain of muscle, fascia and tendon of lower back, initial encounter: Secondary | ICD-10-CM | POA: Diagnosis not present

## 2022-02-05 LAB — POCT URINALYSIS DIP (MANUAL ENTRY)
Bilirubin, UA: NEGATIVE
Blood, UA: NEGATIVE
Glucose, UA: NEGATIVE mg/dL
Ketones, POC UA: NEGATIVE mg/dL
Leukocytes, UA: NEGATIVE
Nitrite, UA: NEGATIVE
Protein Ur, POC: NEGATIVE mg/dL
Spec Grav, UA: 1.025 (ref 1.010–1.025)
Urobilinogen, UA: 0.2 E.U./dL
pH, UA: 6.5 (ref 5.0–8.0)

## 2022-02-05 MED ORDER — CYCLOBENZAPRINE HCL 10 MG PO TABS: 10.0000 mg | ORAL_TABLET | Freq: Three times a day (TID) | ORAL | 0 refills | Status: DC

## 2022-02-05 NOTE — Discharge Instructions (Addendum)
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
## 2022-05-31 ENCOUNTER — Ambulatory Visit
Admission: RE | Admit: 2022-05-31 | Discharge: 2022-05-31 | Disposition: A | Discharge status: Home / Self Care | Source: Ambulatory Visit | Attending: Emergency Medicine | Admitting: Emergency Medicine

## 2022-05-31 VITALS — BP 117/80 | HR 79 | Temp 98.1°F | Resp 18

## 2022-05-31 DIAGNOSIS — Z3202 Encounter for pregnancy test, result negative: Secondary | ICD-10-CM

## 2022-05-31 LAB — POCT URINE PREGNANCY: Preg Test, Ur: NEGATIVE

## 2022-05-31 NOTE — ED Provider Notes (Signed)
[facility]    CSN: [id number] Arrival date & time: 05/31/22  1220      History   Chief Complaint Chief Complaint  Patient presents with   Possible Pregnancy    Need pregnancy test - Entered by patient    HPI Jane Nichols is a 31 y.o. female.  Patient presents with request for pregnancy test.  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  She is asymptomatic.  No fever, chills, abdominal pain, nausea, vomiting, vaginal discharge, pelvic pain, or other symptoms.  LMP 05/31/2022.  The history is provided by the patient and medical records.    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      Home Medications    Prior to Admission medications   Medication Sig Start Date End Date Taking? Authorizing Provider  busPIRone (BUSPAR) 5 MG tablet Take 1-4 tablets (5-20 mg total) by mouth 3 (three) times daily as needed. 06/02/21   Delsa Grana, PA-C  cyclobenzaprine (FLEXERIL) 10 MG tablet Take 1 tablet (10 mg total) by mouth 3 (three) times daily. 02/05/22   Rodriguez-Southworth, Sunday Spillers, PA-C  traZODone (DESYREL) 50 MG tablet Take 1 tablet (50 mg total) by mouth at bedtime as needed for sleep. 06/02/21   Delsa Grana, PA-C    Family History Family History  Problem Relation Age of Onset   Bipolar disorder Mother    Diabetes Paternal Grandfather      Social History Social History   Tobacco Use   Smoking status: Former    Packs/day: 0.50    Years: 4.00    Total pack years: 2.00    Types: Cigarettes   Smokeless tobacco: Never  Vaping Use   Vaping Use: Former  Substance Use Topics   Alcohol use: Yes    Alcohol/week: 1.0 standard drink of alcohol    Types: 1 Cans of beer per week   Drug use: No     Allergies   Patient has no known allergies.   Review of Systems Review of Systems  Constitutional:  Negative for chills and fever.  Gastrointestinal:  Negative for abdominal pain, nausea and vomiting.  Genitourinary:  Negative for dysuria, flank pain, hematuria, pelvic pain and vaginal discharge.  All other systems reviewed and are negative.    Physical Exam Triage Vital Signs ED Triage Vitals  Enc Vitals Group     BP 05/31/22 1237 117/80     Pulse Rate 05/31/22 1237 79     Resp 05/31/22 1237 18     Temp 05/31/22 1237 98.1 F (36.7 C)     Temp src --      SpO2 05/31/22 1237 99 %     Weight --      Height --  Head Circumference --      Peak Flow --      Pain Score 05/31/22 1240 0     Pain Loc --      Pain Edu? --      Excl. in [place]? --    No data found.  Updated Vital Signs BP 117/80   Pulse 79   Temp 98.1 F (36.7 C)   Resp 18   LMP 05/31/2022   SpO2 99%   Visual Acuity Right Eye Distance:   Left Eye Distance:   Bilateral Distance:    Right Eye Near:   Left Eye Near:    Bilateral Near:     Physical Exam Vitals and nursing note reviewed.  Constitutional:      General: She is not in acute distress.    Appearance: Normal appearance. She is well-developed. She is not ill-appearing.  HENT:     Mouth/Throat:     Mouth: Mucous membranes are moist.  Cardiovascular:     Rate and Rhythm: Normal rate and regular rhythm.     Heart sounds: Normal heart sounds.  Pulmonary:     Effort: Pulmonary effort is normal. No respiratory distress.     Breath sounds: Normal breath sounds.  Abdominal:      General: Bowel sounds are normal.     Palpations: Abdomen is soft.     Tenderness: There is no abdominal tenderness. There is no right CVA tenderness, left CVA tenderness, guarding or rebound.  Musculoskeletal:     Cervical back: Neck supple.  Skin:    General: Skin is warm and dry.  Neurological:     Mental Status: She is alert.  Psychiatric:        Mood and Affect: Mood normal.        Behavior: Behavior normal.      UC Treatments / Results  Labs (all labs ordered are listed, but only abnormal results are displayed) Labs Reviewed  POCT URINE PREGNANCY    EKG   Radiology No results found.  Procedures Procedures (including critical care time)  Medications Ordered in UC Medications - No data to display  Initial Impression / Assessment and Plan / UC Course  I have reviewed the triage vital signs and the nursing notes.  Pertinent labs & imaging results that were available during my care of the patient were reviewed by me and considered in my medical decision making (see chart for details).   Negative pregnancy test.  Urine pregnancy test negative.  Patient is asymptomatic.  She needed this test done for her work in the [employer].  Instructed patient to follow up with her PCP as needed.  She agrees to plan of care.     Final Clinical Impressions(s) / UC Diagnoses   Final diagnoses:  Negative pregnancy test     Discharge Instructions      Your pregnancy test is negative.      ED Prescriptions   None    PDMP not reviewed this encounter.   Sharion Balloon, NP 05/31/22 1314

## 2022-05-31 NOTE — Discharge Instructions (Signed)
Your pregnancy test is negative.

## 2022-05-31 NOTE — ED Triage Notes (Signed)
Patient presents to UC for pregnancy test. Asymptomatic.

## 2022-07-05 ENCOUNTER — Ambulatory Visit (INDEPENDENT_AMBULATORY_CARE_PROVIDER_SITE_OTHER): Payer: Self-pay | Admitting: Physician Assistant

## 2022-07-05 ENCOUNTER — Encounter: Payer: Self-pay | Admitting: Physician Assistant

## 2022-07-05 DIAGNOSIS — Z0100 Encounter for examination of eyes and vision without abnormal findings: Secondary | ICD-10-CM

## 2022-07-05 DIAGNOSIS — Z7689 Persons encountering health services in other specified circumstances: Secondary | ICD-10-CM

## 2022-07-05 DIAGNOSIS — H04123 Dry eye syndrome of bilateral lacrimal glands: Secondary | ICD-10-CM | POA: Diagnosis not present

## 2022-07-05 DIAGNOSIS — F411 Generalized anxiety disorder: Secondary | ICD-10-CM | POA: Diagnosis not present

## 2022-07-05 DIAGNOSIS — Z01419 Encounter for gynecological examination (general) (routine) without abnormal findings: Secondary | ICD-10-CM | POA: Diagnosis not present

## 2022-07-05 DIAGNOSIS — R5383 Other fatigue: Secondary | ICD-10-CM

## 2022-07-05 DIAGNOSIS — F339 Major depressive disorder, recurrent, unspecified: Secondary | ICD-10-CM | POA: Diagnosis not present

## 2022-07-05 NOTE — Progress Notes (Signed)
[facility] [place], [place] [address]  Internal MEDICINE  Office Visit Note  Patient Name: Jane Nichols  [id number]  [id number]  Date of Service: 07/05/2022   Complaints/HPI Pt is here for establishment of PCP. Chief Complaint  Patient presents with   New Patient (Initial Visit)   HPI Pt is here to establish care and is need of some referrals -She is full time [employer] -Lives with her girlfriend, dog and cat -Former smoker, quit 2 months ago. Quit drinking at that time as well. No other substance use. -She would like a referral to a gynecologist to establish care and have a pap done.  -She also reports she was seeing a couselor contracted with the [employer], but she moved and changed status and therefore needs a referral to continue counseling. -Does have dry eyes and has tried several eye drops and ointments OTC. She has never been to eye doctor and would like to go ahead and set up referral for this as well. Reports no problems with her vision. Denies any dry mouth symptoms. -No other chronic medical conditions -She sleeps well, about 7 hours per night.  -Does take trazodone and flexeril as needed. No other medications -Will order baseline blood work and set up for CPE as able  Current Medication: Outpatient Encounter Medications as of 07/05/2022  Medication Sig   cyclobenzaprine (FLEXERIL) 10 MG tablet Take 1 tablet (10 mg total) by mouth 3 (three) times daily.   traZODone (DESYREL) 50 MG tablet Take 1 tablet (50 mg total) by mouth at bedtime as needed for sleep.   [DISCONTINUED] busPIRone (BUSPAR) 5 MG tablet Take 1-4 tablets (5-20 mg total) by mouth 3 (three) times daily as needed.   No facility-administered encounter medications on file as of 07/05/2022.    Surgical History: Past Surgical History:  Procedure Laterality Date   FOOT SURGERY     TOE SURGERY Right 2006   bone spur   WISDOM TOOTH EXTRACTION  2012    Medical History: Past  Medical History:  Diagnosis Date   Dysplastic nevus 07/28/2020   left mid back (moderate)   GERD (gastroesophageal reflux disease)    History of dysplastic nevus 09/01/2020   right shoulder, severe, excised Moh's   History of insomnia    Other viral warts    hands    Family History: Family History  Problem Relation Age of Onset   Kidney disease Mother    Bipolar disorder Mother    Diabetes Paternal Grandfather     Social History   Socioeconomic History   Marital status: Single    Spouse name: Not on file   Number of children: Not on file   Years of education: 12   Highest education level: Bachelor's degree (e.g., BA, AB, BS)  Occupational History   Not on file  Tobacco Use   Smoking status: Former    Packs/day: 0.50    Years: 4.00    Total pack years: 2.00    Types: Cigarettes   Smokeless tobacco: Never  Vaping Use   Vaping Use: Former  Substance and Sexual Activity   Alcohol use: Yes    Alcohol/week: 1.0 standard drink of alcohol    Types: 1 Cans of beer per week   Drug use: No   Sexual activity: Yes    Birth control/protection: None    Comment: female partner  Other Topics Concern   Not on file  Social History Narrative   Not on file  Social Determinants of Health   Financial Resource Strain: Low Risk  (06/26/2021)   Overall Financial Resource Strain (CARDIA)    Difficulty of Paying Living Expenses: Not hard at all  Food Insecurity: No Food Insecurity (06/26/2021)   Hunger Vital Sign    Worried About Running Out of Food in the Last Year: Never true    Ran Out of Food in the Last Year: Never true  Transportation Needs: No Transportation Needs (06/26/2021)   PRAPARE - Hydrologist (Medical): No    Lack of Transportation (Non-Medical): No  Physical Activity: Inactive (06/26/2021)   Exercise Vital Sign    Days of Exercise per Week: 0 days    Minutes of Exercise per Session: 0 min  Stress: Stress Concern Present  (06/26/2021)   [place]    Feeling of Stress : To some extent  Social Connections: Moderately Isolated (06/26/2021)   Social Connection and Isolation Panel [NHANES]    Frequency of Communication with Friends and Family: Twice a week    Frequency of Social Gatherings with Friends and Family: Twice a week    Attends Religious Services: 1 to 4 times per year    Active Member of [employer] or Organizations: No    Attends Archivist Meetings: Never    Marital Status: Never married  Intimate Partner Violence: Unknown (08/10/2019)   Humiliation, Afraid, Rape, and Kick questionnaire    Fear of Current or Ex-Partner: No    Emotionally Abused: No    Physically Abused: No    Sexually Abused: Not on file     Review of Systems  Constitutional:  Negative for chills, fatigue and unexpected weight change.  HENT:  Negative for congestion, postnasal drip, rhinorrhea, sneezing and sore throat.   Eyes:  Negative for redness.       Dry eyes  Respiratory:  Negative for cough, chest tightness and shortness of breath.   Cardiovascular:  Negative for chest pain and palpitations.  Gastrointestinal:  Negative for abdominal pain, constipation, diarrhea, nausea and vomiting.  Genitourinary:  Negative for dysuria and frequency.  Musculoskeletal:  Negative for arthralgias, back pain, joint swelling and neck pain.  Skin:  Negative for rash.  Neurological: Negative.  Negative for tremors and numbness.  Hematological:  Negative for adenopathy. Does not bruise/bleed easily.  Psychiatric/Behavioral:  Negative for behavioral problems (Depression), sleep disturbance and suicidal ideas. The patient is not nervous/anxious.     Vital Signs: BP 116/80   Pulse 80   Temp 98.5 F (36.9 C)   Resp 16   Ht '5\' 4"'$  (1.626 m)   Wt 156 lb (70.8 kg)   SpO2 99%   BMI 26.78 kg/m    Physical Exam Vitals and nursing note reviewed.  Constitutional:       General: She is not in acute distress.    Appearance: Normal appearance. She is well-developed. She is not diaphoretic.  HENT:     Head: Normocephalic and atraumatic.     Mouth/Throat:     Pharynx: No oropharyngeal exudate.  Eyes:     Pupils: Pupils are equal, round, and reactive to light.  Neck:     Thyroid: No thyromegaly.     Vascular: No JVD.     Trachea: No tracheal deviation.  Cardiovascular:     Rate and Rhythm: Normal rate and regular rhythm.     Heart sounds: Normal heart sounds. No murmur heard.    No  friction rub. No gallop.  Pulmonary:     Effort: Pulmonary effort is normal. No respiratory distress.     Breath sounds: No wheezing or rales.  Chest:     Chest wall: No tenderness.  Abdominal:     General: Bowel sounds are normal.     Palpations: Abdomen is soft.  Musculoskeletal:        General: Normal range of motion.     Cervical back: Normal range of motion and neck supple.  Lymphadenopathy:     Cervical: No cervical adenopathy.  Skin:    General: Skin is warm and dry.  Neurological:     Mental Status: She is alert and oriented to person, place, and time.     Cranial Nerves: No cranial nerve deficit.  Psychiatric:        Behavior: Behavior normal.        Thought Content: Thought content normal.        Judgment: Judgment normal.       Assessment/Plan: 1. GAD (generalized anxiety disorder) Will refer to counseling - Ambulatory referral to Psychology  2. Depression, recurrent ([place]) Will refer to counseling - Ambulatory referral to Psychology  3. Women's annual routine gynecological examination Will refer to OBGYN to establish routine care and have pap per patient request - Ambulatory referral to Obstetrics / Gynecology  4. Dry eyes Has tried OTC options and is in need of eye exam as well, therefore will refer - Ambulatory referral to Ophthalmology  5. Routine eye exam - Ambulatory referral to Ophthalmology  6. Encounter to establish care with  new doctor Medical hx reviewed and will order baseline fasting labs - CBC w/Diff/Platelet - Comprehensive metabolic panel - Lipid Panel With LDL/HDL Ratio - TSH + free T4  7. Other fatigue - CBC w/Diff/Platelet - Comprehensive metabolic panel - Lipid Panel With LDL/HDL Ratio - TSH + free T4   General Counseling: Nilsa verbalizes understanding of the findings of todays visit and agrees with plan of treatment. I have discussed any further diagnostic evaluation that may be needed or ordered today. We also reviewed her medications today. she has been encouraged to call the office with any questions or concerns that should arise related to todays visit.    Counseling:    Orders Placed This Encounter  Procedures   CBC w/Diff/Platelet   Comprehensive metabolic panel   Lipid Panel With LDL/HDL Ratio   TSH + free T4   Ambulatory referral to Psychology   Ambulatory referral to Obstetrics / Gynecology   Ambulatory referral to Ophthalmology    No orders of the defined types were placed in this encounter.    This patient was seen by Drema Dallas, PA-C in collaboration with Dr. Clayborn Bigness as a part of collaborative care agreement.   Time spent:35 Minutes

## 2022-07-06 ENCOUNTER — Telehealth: Payer: Self-pay | Admitting: Physician Assistant

## 2022-07-06 NOTE — Telephone Encounter (Signed)
Ophthalmology referral faxed to [facility]; [phone]

## 2022-07-06 NOTE — Telephone Encounter (Signed)
Awaiting [employer] approval for [facility] referral-Toni

## 2022-07-06 NOTE — Telephone Encounter (Signed)
GYN referral faxed to [facility]; [phone]

## 2022-07-10 ENCOUNTER — Telehealth: Payer: Self-pay | Admitting: Nurse Practitioner

## 2022-07-10 NOTE — Telephone Encounter (Signed)
[place] referral faxed to [place]; [phone]

## 2022-07-27 ENCOUNTER — Ambulatory Visit
Admission: RE | Admit: 2022-07-27 | Discharge: 2022-07-27 | Disposition: A | Discharge status: Home / Self Care | Source: Ambulatory Visit | Attending: Emergency Medicine | Admitting: Emergency Medicine

## 2022-07-27 ENCOUNTER — Emergency Department (HOSPITAL_BASED_OUTPATIENT_CLINIC_OR_DEPARTMENT_OTHER)
Admission: EM | Admit: 2022-07-27 | Discharge: 2022-07-27 | Disposition: A | Discharge status: Home / Self Care | Attending: Emergency Medicine | Admitting: Emergency Medicine

## 2022-07-27 ENCOUNTER — Other Ambulatory Visit: Payer: Self-pay

## 2022-07-27 ENCOUNTER — Emergency Department (HOSPITAL_BASED_OUTPATIENT_CLINIC_OR_DEPARTMENT_OTHER)

## 2022-07-27 ENCOUNTER — Encounter (HOSPITAL_BASED_OUTPATIENT_CLINIC_OR_DEPARTMENT_OTHER): Payer: Self-pay | Admitting: Emergency Medicine

## 2022-07-27 VITALS — BP 108/73 | HR 87 | Temp 100.2°F | Resp 16

## 2022-07-27 DIAGNOSIS — R509 Fever, unspecified: Secondary | ICD-10-CM

## 2022-07-27 DIAGNOSIS — F17200 Nicotine dependence, unspecified, uncomplicated: Secondary | ICD-10-CM | POA: Insufficient documentation

## 2022-07-27 DIAGNOSIS — M791 Myalgia, unspecified site: Secondary | ICD-10-CM | POA: Diagnosis not present

## 2022-07-27 DIAGNOSIS — Z20822 Contact with and (suspected) exposure to covid-19: Secondary | ICD-10-CM | POA: Insufficient documentation

## 2022-07-27 DIAGNOSIS — R519 Headache, unspecified: Secondary | ICD-10-CM | POA: Insufficient documentation

## 2022-07-27 DIAGNOSIS — R110 Nausea: Secondary | ICD-10-CM | POA: Diagnosis not present

## 2022-07-27 LAB — RESP PANEL BY RT-PCR (FLU A&B, COVID) ARPGX2
Influenza A by PCR: NEGATIVE
Influenza B by PCR: NEGATIVE
SARS Coronavirus 2 by RT PCR: NEGATIVE

## 2022-07-27 MED ORDER — DEXAMETHASONE SODIUM PHOSPHATE 10 MG/ML IJ SOLN
10.0000 mg | Freq: Once | INTRAMUSCULAR | Status: AC
  Administered 2022-07-27: 10 mg via INTRAMUSCULAR
  Filled 2022-07-27: qty 1

## 2022-07-27 MED ORDER — ONDANSETRON HCL 4 MG PO TABS: 4.0000 mg | ORAL_TABLET | Freq: Four times a day (QID) | ORAL | 0 refills | Status: DC

## 2022-07-27 MED ORDER — KETOROLAC TROMETHAMINE 30 MG/ML IJ SOLN
30.0000 mg | Freq: Once | INTRAMUSCULAR | Status: AC
  Administered 2022-07-27: 30 mg via INTRAMUSCULAR
  Filled 2022-07-27: qty 1

## 2022-07-27 NOTE — Discharge Instructions (Addendum)
You were seen today for a bad headache.  This improved with medications.  Your CT scan was reassuring for no acute findings.  Please begin taking Tylenol when you return home tonight.  You may start taking Advil tomorrow morning.  I recommend taking the maximum dose throughout the day.  I have prescribed Zofran for nausea to be used as needed.  I recommend follow-up with your primary care divider as needed.  If you develop any worsening symptoms you may return to the emergency department for reevaluation

## 2022-07-27 NOTE — ED Triage Notes (Signed)
Pt. Presents to UC w/ c/o a headache, nausea and a tingling sensation all over for the past 3 days.

## 2022-07-27 NOTE — Discharge Instructions (Signed)
This could be something simple like COVID or influenza, however, I am concerned for more serious cause of your headache due to the severity.  We also do not have test to real-time COVID/flu testing.  Please go immediately to an emergency department of your choice.  Let them know if your headache changes, gets worse, or for other concerns.

## 2022-07-27 NOTE — ED Triage Notes (Signed)
Headache, light sensitivity, nausea, body pain x 3 days. Sent from UC.

## 2022-07-27 NOTE — ED Provider Notes (Signed)
[facility] Provider Note   CSN: [id number] Arrival date & time: 07/27/22  1247     History  Chief Complaint  Patient presents with   Headache    Jane Nichols is a 31 y.o. female.  Patient presents emergency department complaining of headache with nausea and body aches for the past 3 days.  Patient also complains of mild photosensitivity.  Patient sent for urgent care for possible CT scan since patient reports this being the worst headache of her life.  Symptoms been ongoing for 3 days.  She also endorses nausea.  She denies vomiting, chest pain, shortness of breath, cough, urinary symptoms.  Past medical history significant for GERD  HPI     Home Medications Prior to Admission medications   Medication Sig Start Date End Date Taking? Authorizing Provider  ondansetron (ZOFRAN) 4 MG tablet Take 1 tablet (4 mg total) by mouth every 6 (six) hours. 07/27/22  Yes Dorothyann Peng, PA-C  cyclobenzaprine (FLEXERIL) 10 MG tablet Take 1 tablet (10 mg total) by mouth 3 (three) times daily. 02/05/22   Rodriguez-Southworth, Sunday Spillers, PA-C  traZODone (DESYREL) 50 MG tablet Take 1 tablet (50 mg total) by mouth at bedtime as needed for sleep. 06/02/21   Delsa Grana, PA-C      Allergies    Patient has no known allergies.    Review of Systems   Review of Systems  Constitutional:  Positive for fever.  Eyes:  Positive for photophobia.  Respiratory:  Negative for shortness of breath.   Cardiovascular:  Negative for chest pain.  Gastrointestinal:  Positive for nausea. Negative for abdominal pain and vomiting.  Neurological:  Positive for headaches.    Physical Exam Updated Vital Signs BP 103/60   Pulse 73   Temp 98.2 F (36.8 C) (Oral)   Resp 16   Ht '5\' 4"'$  (1.626 m)   Wt 70.3 kg   LMP 07/22/2022 (Approximate)   SpO2 100%   BMI 26.61 kg/m  Physical Exam Vitals and nursing note reviewed.  Constitutional:      General: She is not in acute distress.     Appearance: She is normal weight.  HENT:     Head: Normocephalic and atraumatic.     Mouth/Throat:     Mouth: Mucous membranes are moist.  Eyes:     General: No scleral icterus.    Extraocular Movements: Extraocular movements intact.     Right eye: No nystagmus.     Left eye: No nystagmus.     Pupils: Pupils are equal, round, and reactive to light. Pupils are equal.  Neck:     Meningeal: Brudzinski's sign and Kernig's sign absent.  Cardiovascular:     Rate and Rhythm: Normal rate and regular rhythm.     Heart sounds: Normal heart sounds.  Pulmonary:     Effort: Pulmonary effort is normal.     Breath sounds: Normal breath sounds.  Abdominal:     General: There is no distension.     Palpations: Abdomen is soft.     Tenderness: There is no abdominal tenderness.  Musculoskeletal:        General: Normal range of motion.     Cervical back: Normal range of motion and neck supple. No rigidity.  Skin:    General: Skin is warm and dry.  Neurological:     Mental Status: She is alert.     ED Results / Procedures / Treatments   Labs (all labs ordered are listed,  but only abnormal results are displayed) Labs Reviewed  RESP PANEL BY RT-PCR (FLU A&B, COVID) [id number]    EKG None  Radiology CT Head Wo Contrast  Result Date: 07/27/2022 CLINICAL DATA:  Headache, sudden, severe EXAM: CT HEAD WITHOUT CONTRAST TECHNIQUE: Contiguous axial images were obtained from the base of the skull through the vertex without intravenous contrast. RADIATION DOSE REDUCTION: This exam was performed according to the departmental dose-optimization program which includes automated exposure control, adjustment of the mA and/or kV according to patient size and/or use of iterative reconstruction technique. COMPARISON:  None Available. FINDINGS: Brain: No evidence of acute infarction, hemorrhage, hydrocephalus, extra-axial collection or mass lesion/mass effect. Vascular: No hyperdense vessel identified. Skull: No  acute fracture. Sinuses/Orbits: Clear sinuses.  No acute orbital findings. Other: No mastoid effusions. IMPRESSION: No evidence of acute intracranial abnormality. Electronically Signed   By: Margaretha Sheffield M.D.   On: 07/27/2022 17:20    Procedures Procedures    Medications Ordered in ED Medications  ketorolac (TORADOL) 30 MG/ML injection 30 mg (30 mg Intramuscular Given 07/27/22 1749)  dexamethasone (DECADRON) injection 10 mg (10 mg Intramuscular Given 07/27/22 1749)    ED Course/ Medical Decision Making/ A&P                           Medical Decision Making Amount and/or Complexity of Data Reviewed Radiology: ordered.  Risk Prescription drug management.   This patient presents to the ED for concern of headache, this involves an extensive number of treatment options, and is a complaint that carries with it a high risk of complications and morbidity.  The differential diagnosis includes but is not limited to viral illness, COVID-19, influenza, meningitis, migraine, cluster headache, and others   Co morbidities that complicate the patient evaluation  Current smoker   Additional history obtained:  Additional history obtained from friend at bedside External records from outside source obtained and reviewed including urgent care notes from earlier today   Lab Tests:  I Ordered, and personally interpreted labs.  The pertinent results include: Negative respiratory panel   Imaging Studies ordered:  I ordered imaging studies including CT head without contrast I independently visualized and interpreted imaging which showed no acute findings I agree with the radiologist interpretation   Problem List / ED Course / Critical interventions / Medication management   I ordered medication including Decadron and Toradol for headache Reevaluation of the patient after these medicines showed that the patient improved I have reviewed the patients home medicines and have made  adjustments as needed    Test / Admission - Considered:  Patient does have mild photosensitivity chief complaint is a headache.  She has no neck stiffness, negative Brudzinski's and Kernig signs.  Very low clinical suspicion at this time of meningitis.  Discussed with my attending provider who agreed that there is no need at this time for spinal tap.  Patient feels much better after medications.  Feel this is likely a viral illness which is causing headache.  Plan to discharge patient home at this time with recommendation that she take Tylenol starting tonight and begin taking Advil tomorrow.  If her headache worsens or she develops other life-threatening symptoms I recommend she come back to the emergency department        Final Clinical Impression(s) / ED Diagnoses Final diagnoses:  Acute nonintractable headache, unspecified headache type  Fever, unspecified fever cause    Rx / DC Orders ED Discharge Orders  Ordered    ondansetron (ZOFRAN) 4 MG tablet  Every 6 hours        07/27/22 1928              Ronny Bacon 07/27/22 [place], [place] A, DO 08/01/22 [phone]

## 2022-07-27 NOTE — ED Provider Notes (Signed)
HPI  SUBJECTIVE:  Jane Nichols is a 31 y.o. female who reports 3 days of a gradual onset, constant, diffuse waxing and waning severe headache accompanied with nausea, fevers Tmax 101, photophobia, phonophobia and body aches.  She states that she picks her nose, and sustained a laceration on her right nasal mucosa 1 week ago, but was fine for 4 days prior to her symptoms starting.  She states that this is the worst headache she has ever had, and that it is waking her up hourly at night.  No vomiting, nasal congestion, rhinorrhea, sinus pain or pressure, ear pain, dental pain, visual changes, arm or leg weakness, facial droop, slurred speech, discoordination, rash, neck stiffness, loss of sense of smell or taste, postnasal drip, sore throat, cough, wheeze, shortness of breath, abdominal pain, diarrhea.  No syncope, seizures.  No known COVID or flu exposure.  She got a single dose of the COVID-vaccine.  She got this years flu vaccine.  She tried Tylenol, ibuprofen, electrolyte containing fluids, caffeine, increasing her water intake and eating without improvement in her symptoms.  Symptoms are worse in the morning, at night, with loud noises and lights, and neck movement.  She has a past medical history of tension/sinus headaches.  LMP: 2 days ago.  Denies possibility of being pregnant.  PCP: [employer].   Past Medical History:  Diagnosis Date   Dysplastic nevus 07/28/2020   left mid back (moderate)   GERD (gastroesophageal reflux disease)    History of dysplastic nevus 09/01/2020   right shoulder, severe, excised Moh's   History of insomnia    Other viral warts    hands    Past Surgical History:  Procedure Laterality Date   FOOT SURGERY     TOE SURGERY Right 2006   bone spur   WISDOM TOOTH EXTRACTION  2012    Family History  Problem Relation Age of Onset   Kidney disease Mother    Bipolar disorder Mother    Diabetes Paternal Grandfather     Social History    Tobacco Use   Smoking status: Former    Packs/day: 0.50    Years: 4.00    Total pack years: 2.00    Types: Cigarettes   Smokeless tobacco: Never  Vaping Use   Vaping Use: Former  Substance Use Topics   Alcohol use: Yes    Alcohol/week: 1.0 standard drink of alcohol    Types: 1 Cans of beer per week   Drug use: No    No current facility-administered medications for this encounter.  Current Outpatient Medications:    cyclobenzaprine (FLEXERIL) 10 MG tablet, Take 1 tablet (10 mg total) by mouth 3 (three) times daily., Disp: 30 tablet, Rfl: 0   traZODone (DESYREL) 50 MG tablet, Take 1 tablet (50 mg total) by mouth at bedtime as needed for sleep., Disp: 90 tablet, Rfl: 3  No Known Allergies   ROS  As noted in HPI.   Physical Exam  BP 108/73 (BP Location: Left Arm)   Pulse 87   Temp 100.2 F (37.9 C) (Oral)   Resp 16   LMP 07/22/2022 (Approximate)   SpO2 97%   Constitutional: Well developed, well nourished, sitting in a darkened room with glasses Eyes: PERRL, EOMI, conjunctiva normal bilaterally.  Bilateral photophobia. HENT: Normocephalic, atraumatic,mucus membranes moist, positive laceration at the base of the right nare.  No surrounding swelling.  Normal dentition.  TM normal b/l. No TMJ tenderness. No nasal congestion, no sinus tenderness. No  temporal artery tenderness.  Neck: no cervical LN.  No trapezial muscle tenderness. No meningismus Respiratory: normal inspiratory effort Cardiovascular: Normal rate, regular rhythm GI:  nondistended skin: No rash, skin intact Musculoskeletal: No edema, no tenderness, no deformities Neurologic: Alert & oriented x 3, CN III-XII intact, romberg neg, finger-> nose, heel-> shin equal b/l, Romberg neg, tandem gait steady.  GCS 15.  Speech fluent. Psychiatric: Speech and behavior appropriate   ED Course   Medications - No data to display  No orders of the defined types were placed in this encounter.  No results found for  this or any previous visit (from the past 24 hour(s)). No results found.   ED Clinical Impression  1. Acute intractable headache, unspecified headache type   2. Fever, unspecified fever cause     ED Assessment/Plan     Patient with an acute illness with systemic symptoms of fever.  Patient is describing the worst headache of her life, states that she is waking up multiple times a night due to the pain.  She has never had a headache like this before.  Could be COVID or flu, however, do not have access to same-day testing here.  In the differential is meningitis.  Does not appear to be sinusitis in the absence of nasal congestion or sinus tenderness.  Low on the differential is intracranial infection due to laceration nasal mucosa.  Doubt cavernous sinus thrombosis in the absence of other neurologic signs or symptoms.  Transferring to the emergency department via private vehicle for further evaluation.  She states that her friend drove her here. she is stable to go by private vehicle.  Discussed rationale for transfer to the emergency department with patient.  She agrees to go.  No orders of the defined types were placed in this encounter.   *This clinic note was created using Dragon dictation software. Therefore, there may be occasional mistakes despite careful proofreading.  ?    Melynda Ripple, MD 07/27/22 1112

## 2022-07-29 ENCOUNTER — Telehealth: Payer: Self-pay | Admitting: Physician Assistant

## 2022-07-29 NOTE — Telephone Encounter (Signed)
Received vm Sunday @ 10:00 requesting appointment as soon as possible. Video appointment made with patient for 07/30/22 @ 10:20-Jane Nichols

## 2022-07-30 ENCOUNTER — Encounter: Payer: Self-pay | Admitting: Physician Assistant

## 2022-07-30 ENCOUNTER — Telehealth (INDEPENDENT_AMBULATORY_CARE_PROVIDER_SITE_OTHER): Payer: Self-pay | Admitting: Physician Assistant

## 2022-07-30 VITALS — Temp 98.3°F | Resp 16 | Ht 64.0 in

## 2022-07-30 DIAGNOSIS — G43909 Migraine, unspecified, not intractable, without status migrainosus: Secondary | ICD-10-CM | POA: Diagnosis not present

## 2022-07-30 DIAGNOSIS — R52 Pain, unspecified: Secondary | ICD-10-CM | POA: Diagnosis not present

## 2022-07-30 MED ORDER — ACETAMINOPHEN-CODEINE 300-30 MG PO TABS: ORAL_TABLET | ORAL | 0 refills | Status: DC

## 2022-07-30 MED ORDER — DOXYCYCLINE HYCLATE 100 MG PO TABS: 100.0000 mg | ORAL_TABLET | Freq: Two times a day (BID) | ORAL | 0 refills | Status: DC

## 2022-07-30 NOTE — Progress Notes (Signed)
[facility] [place], [place] [address]  Internal MEDICINE  Telephone Visit  Patient Name: Jane Nichols  [id number]  [id number]  Date of Service: 08/07/2022  I connected with the patient at 11:16 by telephone and verified the patients identity using two identifiers.   I discussed the limitations, risks, security and privacy concerns of performing an evaluation and management service by telephone and the availability of in person appointments. I also discussed with the patient that there may be a patient responsible charge related to the service.  The patient expressed understanding and agrees to proceed.    Chief Complaint  Patient presents with   Telephone Screen    [phone], prefers telephone call   Telephone Assessment   Migraine    Migraine since Wednesday, Negative for Covid/Flu   Fever   Nausea   Generalized Body Aches    HPI Pt is here for virtual sick visit -she has been experiencing a Migraine headache since Wednesday as well as fever and body aches. -She went to urgent care/ED for this on 11/10 and was neg for flu/covid but did not have other labs drawn. She did have a CT of her head done as this was the worst headache she's experienced and this was negative for any acute abnormalities -It was believed to be due to viral infection and she was discharged and instructed to take tylenol/ibuprofen which she has been -no cough, SOB, or significant congestion -Also tried Sudafed and mucinex -migraine cocktail in ED helped some, but did not last long -She does not recall any exposures or tick bites. Only thing she mentions before tho this starting is that she did cut inside of nose and bled briefly about a week ago prior to symptoms starting. She does wonder if any underlying infection from this could be contributing. -Fever has been 102F, down to 98 now on medication -Will go ahead and order labs and may start Doxycycline just in case underlying  infection contributing, though did explain if viral this will take time to improve. Will also send tylenol #3 to help with pain  Current Medication: Outpatient Encounter Medications as of 07/30/2022  Medication Sig   acetaminophen-codeine (TYLENOL #3) 300-30 MG tablet Take one tab po qhs for pain   cyclobenzaprine (FLEXERIL) 10 MG tablet Take 1 tablet (10 mg total) by mouth 3 (three) times daily.   doxycycline (VIBRA-TABS) 100 MG tablet Take 1 tablet (100 mg total) by mouth 2 (two) times daily.   ondansetron (ZOFRAN) 4 MG tablet Take 1 tablet (4 mg total) by mouth every 6 (six) hours.   traZODone (DESYREL) 50 MG tablet Take 1 tablet (50 mg total) by mouth at bedtime as needed for sleep.   No facility-administered encounter medications on file as of 07/30/2022.    Surgical History: Past Surgical History:  Procedure Laterality Date   FOOT SURGERY     TOE SURGERY Right 2006   bone spur   WISDOM TOOTH EXTRACTION  2012    Medical History: Past Medical History:  Diagnosis Date   Dysplastic nevus 07/28/2020   left mid back (moderate)   GERD (gastroesophageal reflux disease)    History of dysplastic nevus 09/01/2020   right shoulder, severe, excised Moh's   History of insomnia    Other viral warts    hands    Family History: Family History  Problem Relation Age of Onset   Kidney disease Mother    Bipolar disorder Mother    Diabetes Paternal  Grandfather     Social History   Socioeconomic History   Marital status: Single    Spouse name: Not on file   Number of children: Not on file   Years of education: 12   Highest education level: Bachelor's degree (e.g., BA, AB, BS)  Occupational History   Not on file  Tobacco Use   Smoking status: Former    Packs/day: 0.50    Years: 4.00    Total pack years: 2.00    Types: Cigarettes   Smokeless tobacco: Never  Vaping Use   Vaping Use: Former  Substance and Sexual Activity   Alcohol use: Not Currently    Alcohol/week: 1.0  standard drink of alcohol    Types: 1 Cans of beer per week   Drug use: No   Sexual activity: Yes    Birth control/protection: None    Comment: female partner  Other Topics Concern   Not on file  Social History Narrative   Not on file   Social Determinants of Health   Financial Resource Strain: Low Risk  (06/26/2021)   Overall Financial Resource Strain (CARDIA)    Difficulty of Paying Living Expenses: Not hard at all  Food Insecurity: No Food Insecurity (06/26/2021)   Hunger Vital Sign    Worried About Running Out of Food in the Last Year: Never true    Ran Out of Food in the Last Year: Never true  Transportation Needs: No Transportation Needs (06/26/2021)   PRAPARE - Hydrologist (Medical): No    Lack of Transportation (Non-Medical): No  Physical Activity: Inactive (06/26/2021)   Exercise Vital Sign    Days of Exercise per Week: 0 days    Minutes of Exercise per Session: 0 min  Stress: Stress Concern Present (06/26/2021)   [place]    Feeling of Stress : To some extent  Social Connections: Moderately Isolated (06/26/2021)   Social Connection and Isolation Panel [NHANES]    Frequency of Communication with Friends and Family: Twice a week    Frequency of Social Gatherings with Friends and Family: Twice a week    Attends Religious Services: 1 to 4 times per year    Active Member of [employer] or Organizations: No    Attends Archivist Meetings: Never    Marital Status: Never married  Intimate Partner Violence: Unknown (08/10/2019)   Humiliation, Afraid, Rape, and Kick questionnaire    Fear of Current or Ex-Partner: No    Emotionally Abused: No    Physically Abused: No    Sexually Abused: Not on file      Review of Systems  Constitutional:  Positive for fatigue. Negative for fever.  HENT:  Negative for congestion, mouth sores and postnasal drip.   Respiratory:   Negative for cough and shortness of breath.   Cardiovascular:  Negative for chest pain.  Genitourinary:  Negative for flank pain.  Musculoskeletal:  Positive for myalgias.  Neurological:  Positive for headaches.  Psychiatric/Behavioral: Negative.      Vital Signs: Temp 98.3 F (36.8 C)   Resp 16   Ht _0  (1.626 m)   LMP 07/22/2022 (Approximate)   BMI 26.61 kg/m    Observation/Objective:  Pt is able to carry out conversation   Assessment/Plan: 1. Acute migraine Likely viral infection triggering migraine, will go ahead and check labs and use tylenol#3 to help with pain. If acute worsening should return to ED - CBC  w/Diff/Platelet - Comprehensive metabolic panel - Sed Rate (ESR) - acetaminophen-codeine (TYLENOL #3) 300-30 MG tablet; Take one tab po qhs for pain  Dispense: 15 tablet; Refill: 0  2. Generalized body aches Will check labs, may trial doxycycline due to concern for infection from nasal cut, but explained likely viral infection contributing - CBC w/Diff/Platelet - Comprehensive metabolic panel - Sed Rate (ESR) - acetaminophen-codeine (TYLENOL #3) 300-30 MG tablet; Take one tab po qhs for pain  Dispense: 15 tablet; Refill: 0 - doxycycline (VIBRA-TABS) 100 MG tablet; Take 1 tablet (100 mg total) by mouth 2 (two) times daily.  Dispense: 14 tablet; Refill: 0   General Counseling: Dyamon verbalizes understanding of the findings of today's phone visit and agrees with plan of treatment. I have discussed any further diagnostic evaluation that may be needed or ordered today. We also reviewed her medications today. she has been encouraged to call the office with any questions or concerns that should arise related to todays visit.    Orders Placed This Encounter  Procedures   CBC w/Diff/Platelet   Comprehensive metabolic panel   Sed Rate (ESR)    Meds ordered this encounter  Medications   acetaminophen-codeine (TYLENOL #3) 300-30 MG tablet    Sig: Take one tab po  qhs for pain    Dispense:  15 tablet    Refill:  0   doxycycline (VIBRA-TABS) 100 MG tablet    Sig: Take 1 tablet (100 mg total) by mouth 2 (two) times daily.    Dispense:  14 tablet    Refill:  0    Time spent:30 Minutes    Dr Lavera Guise Internal medicine

## 2022-07-31 ENCOUNTER — Encounter: Payer: Self-pay | Admitting: Physician Assistant

## 2022-07-31 ENCOUNTER — Telehealth: Payer: Self-pay

## 2022-07-31 LAB — CBC WITH DIFFERENTIAL/PLATELET
Basophils Absolute: 0 10*3/uL (ref 0.0–0.2)
Basos: 1 %
EOS (ABSOLUTE): 0.1 10*3/uL (ref 0.0–0.4)
Eos: 1 %
Hematocrit: 41.3 % (ref 34.0–46.6)
Hemoglobin: 13.7 g/dL (ref 11.1–15.9)
Immature Grans (Abs): 0 10*3/uL (ref 0.0–0.1)
Immature Granulocytes: 0 %
Lymphocytes Absolute: 1.2 10*3/uL (ref 0.7–3.1)
Lymphs: 23 %
MCH: 30.5 pg (ref 26.6–33.0)
MCHC: 33.2 g/dL (ref 31.5–35.7)
MCV: 92 fL (ref 79–97)
Monocytes Absolute: 0.7 10*3/uL (ref 0.1–0.9)
Monocytes: 14 %
Neutrophils Absolute: 3.3 10*3/uL (ref 1.4–7.0)
Neutrophils: 61 %
Platelets: 232 10*3/uL (ref 150–450)
RBC: 4.49 x10E6/uL (ref 3.77–5.28)
RDW: 11.4 % — ABNORMAL LOW (ref 11.7–15.4)
WBC: 5.4 10*3/uL (ref 3.4–10.8)

## 2022-07-31 LAB — COMPREHENSIVE METABOLIC PANEL
ALT: 9 IU/L (ref 0–32)
AST: 13 IU/L (ref 0–40)
Albumin/Globulin Ratio: 2.2 (ref 1.2–2.2)
Albumin: 4.9 g/dL (ref 3.9–4.9)
Alkaline Phosphatase: 56 IU/L (ref 44–121)
BUN/Creatinine Ratio: 12 (ref 9–23)
BUN: 10 mg/dL (ref 6–20)
Bilirubin Total: 0.4 mg/dL (ref 0.0–1.2)
CO2: 23 mmol/L (ref 20–29)
Calcium: 9 mg/dL (ref 8.7–10.2)
Chloride: 100 mmol/L (ref 96–106)
Creatinine, Ser: 0.82 mg/dL (ref 0.57–1.00)
Globulin, Total: 2.2 g/dL (ref 1.5–4.5)
Glucose: 104 mg/dL — ABNORMAL HIGH (ref 70–99)
Potassium: 4.2 mmol/L (ref 3.5–5.2)
Sodium: 140 mmol/L (ref 134–144)
Total Protein: 7.1 g/dL (ref 6.0–8.5)
eGFR: 98 mL/min/{1.73_m2} (ref >59)

## 2022-07-31 LAB — SEDIMENTATION RATE: Sed Rate: 2 mm/hr (ref 0–32)

## 2022-07-31 NOTE — Telephone Encounter (Signed)
Pt advised lab came back normal

## 2022-07-31 NOTE — Telephone Encounter (Signed)
-----   Message from Mylinda Latina, PA-C sent at 07/31/2022  8:17 AM EST ----- Please let her know that her labs came back normal

## 2022-07-31 NOTE — Telephone Encounter (Signed)
Pt advised labs came back normal

## 2022-08-01 ENCOUNTER — Telehealth: Payer: Self-pay | Admitting: Physician Assistant

## 2022-08-01 NOTE — Telephone Encounter (Signed)
Pt advised that we make her appt with [facility] and advised her if her symptoms  go to ED

## 2022-08-01 NOTE — Telephone Encounter (Signed)
Lmom to call us back 

## 2022-08-01 NOTE — Telephone Encounter (Signed)
Error

## 2022-08-02 ENCOUNTER — Ambulatory Visit (INDEPENDENT_AMBULATORY_CARE_PROVIDER_SITE_OTHER): Payer: Self-pay | Admitting: Internal Medicine

## 2022-08-02 ENCOUNTER — Encounter: Payer: Self-pay | Admitting: Internal Medicine

## 2022-08-02 VITALS — BP 110/78 | HR 80 | Temp 98.2°F | Resp 16 | Ht 64.0 in | Wt 151.2 lb

## 2022-08-02 DIAGNOSIS — G4709 Other insomnia: Secondary | ICD-10-CM

## 2022-08-02 DIAGNOSIS — G44011 Episodic cluster headache, intractable: Secondary | ICD-10-CM | POA: Diagnosis not present

## 2022-08-02 MED ORDER — TRAMADOL HCL 50 MG PO TABS: ORAL_TABLET | ORAL | 0 refills | Status: DC

## 2022-08-02 MED ORDER — SUMATRIPTAN SUCCINATE 50 MG PO TABS: ORAL_TABLET | ORAL | 0 refills | Status: DC

## 2022-08-02 MED ORDER — TOPIRAMATE 25 MG PO TABS: ORAL_TABLET | ORAL | 3 refills | Status: DC

## 2022-08-02 NOTE — Progress Notes (Signed)
[facility] [place], [place] [address]  Internal MEDICINE  Office Visit Note  Patient Name: Jane Nichols  [id number]  [id number]  Date of Service: 10/11/2022  Chief Complaint  Patient presents with   Acute Visit    Nausea, vomiting and migraine     HPI  Pt is here for sick visit Episode of headaches, Nausea, has been worse lately, intesity of pain goes down but does not resolve She does not have any previous h/o migraines or cluster headaches  She does not recall any problem with clenching or grinding her teeth at night She has not been on any new medications  Recent eye exam  Takes Trazodone for sleep    Current Medication: Outpatient Encounter Medications as of 08/02/2022  Medication Sig   acetaminophen-codeine (TYLENOL #3) 300-30 MG tablet Take one tab po qhs for pain   cyclobenzaprine (FLEXERIL) 10 MG tablet Take 1 tablet (10 mg total) by mouth 3 (three) times daily.   doxycycline (VIBRA-TABS) 100 MG tablet Take 1 tablet (100 mg total) by mouth 2 (two) times daily.   SUMAtriptan (IMITREX) 50 MG tablet Take one tab at the onset of headaches and may repeat in one in 2 hrs   topiramate (TOPAMAX) 25 MG tablet Take one tab a t night for prevention of migraine headaches   traMADol (ULTRAM) 50 MG tablet Take one tab po qhs for headaches   traZODone (DESYREL) 50 MG tablet Take 1 tablet (50 mg total) by mouth at bedtime as needed for sleep.   [DISCONTINUED] ondansetron (ZOFRAN) 4 MG tablet Take 1 tablet (4 mg total) by mouth every 6 (six) hours.   No facility-administered encounter medications on file as of 08/02/2022.    Surgical History: Past Surgical History:  Procedure Laterality Date   FOOT SURGERY     TOE SURGERY Right 2006   bone spur   WISDOM TOOTH EXTRACTION  2012    Medical History: Past Medical History:  Diagnosis Date   Dysplastic nevus 07/28/2020   left mid back (moderate)   GERD (gastroesophageal reflux disease)    History  of dysplastic nevus 09/01/2020   right shoulder, severe, excised Moh's   History of insomnia    Other viral warts    hands    Family History: Family History  Problem Relation Age of Onset   Kidney disease Mother    Bipolar disorder Mother    Diabetes Paternal Grandfather     Social History   Socioeconomic History   Marital status: Single    Spouse name: Not on file   Number of children: Not on file   Years of education: 12   Highest education level: Bachelor's degree (e.g., BA, AB, BS)  Occupational History   Not on file  Tobacco Use   Smoking status: Former    Packs/day: 0.50    Years: 4.00    Total pack years: 2.00    Types: Cigarettes   Smokeless tobacco: Never  Vaping Use   Vaping Use: Former  Substance and Sexual Activity   Alcohol use: Not Currently    Alcohol/week: 1.0 standard drink of alcohol    Types: 1 Cans of beer per week   Drug use: No   Sexual activity: Yes    Birth control/protection: None    Comment: female partner  Other Topics Concern   Not on file  Social History Narrative   Not on file   Social Determinants of Health   Financial Resource Strain:  Low Risk  (06/26/2021)   Overall Financial Resource Strain (CARDIA)    Difficulty of Paying Living Expenses: Not hard at all  Food Insecurity: No Food Insecurity (06/26/2021)   Hunger Vital Sign    Worried About Running Out of Food in the Last Year: Never true    Ran Out of Food in the Last Year: Never true  Transportation Needs: No Transportation Needs (06/26/2021)   PRAPARE - Hydrologist (Medical): No    Lack of Transportation (Non-Medical): No  Physical Activity: Inactive (06/26/2021)   Exercise Vital Sign    Days of Exercise per Week: 0 days    Minutes of Exercise per Session: 0 min  Stress: Stress Concern Present (06/26/2021)   [place]    Feeling of Stress : To some extent  Social  Connections: Moderately Isolated (06/26/2021)   Social Connection and Isolation Panel [NHANES]    Frequency of Communication with Friends and Family: Twice a week    Frequency of Social Gatherings with Friends and Family: Twice a week    Attends Religious Services: 1 to 4 times per year    Active Member of [employer] or Organizations: No    Attends Archivist Meetings: Never    Marital Status: Never married  Intimate Partner Violence: Unknown (08/10/2019)   Humiliation, Afraid, Rape, and Kick questionnaire    Fear of Current or Ex-Partner: No    Emotionally Abused: No    Physically Abused: No    Sexually Abused: Not on file      Review of Systems  Constitutional:  Negative for fatigue and fever.  HENT:  Negative for congestion, mouth sores and postnasal drip.   Respiratory:  Negative for cough.   Cardiovascular:  Negative for chest pain.  Genitourinary:  Negative for flank pain.  Neurological:  Positive for light-headedness and headaches.  Psychiatric/Behavioral: Negative.      Vital Signs: BP 110/78   Pulse 80   Temp 98.2 F (36.8 C)   Resp 16   Ht '5\' 4"'$  (1.626 m)   Wt 151 lb 3.2 oz (68.6 kg)   LMP 07/22/2022 (Approximate)   SpO2 98%   BMI 25.95 kg/m    Physical Exam Constitutional:      Appearance: Normal appearance.  HENT:     Head: Normocephalic and atraumatic.     Nose: Nose normal.     Mouth/Throat:     Mouth: Mucous membranes are moist.     Pharynx: No posterior oropharyngeal erythema.  Eyes:     Extraocular Movements: Extraocular movements intact.     Pupils: Pupils are equal, round, and reactive to light.  Cardiovascular:     Pulses: Normal pulses.     Heart sounds: Normal heart sounds.  Pulmonary:     Effort: Pulmonary effort is normal.     Breath sounds: Normal breath sounds.  Neurological:     General: No focal deficit present.     Mental Status: She is alert.  Psychiatric:        Mood and Affect: Mood normal.        Behavior: Behavior  normal.        Assessment/Plan: 1. Intractable episodic cluster headache Both Abortive and preventive treatment is started today, however no previous h/o these types of headaches, it was advised to see Neurology for further diagnostics and treatment Update eye exam, limit any straining of eyes  - topiramate (TOPAMAX) 25 MG  tablet; Take one tab a t night for prevention of migraine headaches  Dispense: 30 tablet; Refill: 3 - traMADol (ULTRAM) 50 MG tablet; Take one tab po qhs for headaches  Dispense: 15 tablet; Refill: 0 - SUMAtriptan (IMITREX) 50 MG tablet; Take one tab at the onset of headaches and may repeat in one in 2 hrs  Dispense: 10 tablet; Refill: 0 - Ambulatory referral to Neurology  2. Other insomnia Continue Trazodone as before    General Counseling: Kayzlee verbalizes understanding of the findings of todays visit and agrees with plan of treatment. I have discussed any further diagnostic evaluation that may be needed or ordered today. We also reviewed her medications today. she has been encouraged to call the office with any questions or concerns that should arise related to todays visit.    Orders Placed This Encounter  Procedures   Ambulatory referral to Neurology    Meds ordered this encounter  Medications   topiramate (TOPAMAX) 25 MG tablet    Sig: Take one tab a t night for prevention of migraine headaches    Dispense:  30 tablet    Refill:  3   traMADol (ULTRAM) 50 MG tablet    Sig: Take one tab po qhs for headaches    Dispense:  15 tablet    Refill:  0   SUMAtriptan (IMITREX) 50 MG tablet    Sig: Take one tab at the onset of headaches and may repeat in one in 2 hrs    Dispense:  10 tablet    Refill:  0    Total time spent:35 Minutes Time spent includes review of chart, medications, test results, and follow up plan with the patient.   [place] Controlled Substance Database was reviewed by me.   Dr Lavera Guise Internal medicine

## 2022-08-20 ENCOUNTER — Other Ambulatory Visit: Payer: Self-pay

## 2022-08-20 ENCOUNTER — Telehealth: Payer: Self-pay | Admitting: Physician Assistant

## 2022-08-20 MED ORDER — ONDANSETRON HCL 4 MG PO TABS: 4.0000 mg | ORAL_TABLET | Freq: Four times a day (QID) | ORAL | 0 refills | Status: DC

## 2022-08-20 NOTE — Telephone Encounter (Signed)
Pt notified we send med  

## 2022-08-24 ENCOUNTER — Encounter: Payer: Self-pay | Admitting: Physician Assistant

## 2022-08-24 ENCOUNTER — Telehealth: Payer: Self-pay | Admitting: Physician Assistant

## 2022-08-24 NOTE — Telephone Encounter (Signed)
Lvm to r/s 08/24/22 cpe-Toni

## 2022-08-29 ENCOUNTER — Other Ambulatory Visit: Payer: Self-pay | Admitting: Internal Medicine

## 2022-12-19 ENCOUNTER — Telehealth: Payer: Self-pay | Admitting: Physician Assistant

## 2022-12-19 NOTE — Telephone Encounter (Signed)
Lvm & sent message to reschedule cancelled 08/24/22 cpe & to discuss neurology referral-Toni

## 2023-04-01 ENCOUNTER — Ambulatory Visit (INDEPENDENT_AMBULATORY_CARE_PROVIDER_SITE_OTHER): Admitting: Nurse Practitioner

## 2023-04-01 ENCOUNTER — Encounter: Payer: Self-pay | Admitting: Nurse Practitioner

## 2023-04-01 VITALS — BP 110/72 | HR 81 | Temp 98.6°F | Resp 16 | Ht 64.0 in | Wt 153.6 lb

## 2023-04-01 DIAGNOSIS — B001 Herpesviral vesicular dermatitis: Secondary | ICD-10-CM | POA: Diagnosis not present

## 2023-04-01 MED ORDER — VALACYCLOVIR HCL 1 G PO TABS: 1000.0000 mg | ORAL_TABLET | Freq: Two times a day (BID) | ORAL | 2 refills | Status: AC

## 2023-04-01 NOTE — Progress Notes (Signed)
[facility] [address] [place], [place] [address]  Internal MEDICINE  Office Visit Note  Patient Name: Jane Nichols  [id number]  [id number]  Date of Service: 04/01/2023  Chief Complaint  Patient presents with   Acute Visit    Outbreak of cold sores     HPI Jane Nichols presents for an acute sick visit for cold sores  --onset: has had 2 cold sores in the past month. Lower left lip currently. Wants to try medication has had them for years and usually only gets them every once in a few years.  Has never been on medication before.      Current Medication:  Outpatient Encounter Medications as of 04/01/2023  Medication Sig   acetaminophen-codeine (TYLENOL #3) 300-30 MG tablet Take one tab po qhs for pain   cyclobenzaprine (FLEXERIL) 10 MG tablet Take 1 tablet (10 mg total) by mouth 3 (three) times daily.   doxycycline (VIBRA-TABS) 100 MG tablet Take 1 tablet (100 mg total) by mouth 2 (two) times daily.   ondansetron (ZOFRAN) 4 MG tablet Take 1 tablet (4 mg total) by mouth every 6 (six) hours.   SUMAtriptan (IMITREX) 50 MG tablet Take one tab at the onset of headaches and may repeat in one in 2 hrs   topiramate (TOPAMAX) 25 MG tablet Take one tab a t night for prevention of migraine headaches   traMADol (ULTRAM) 50 MG tablet Take one tab po qhs for headaches   traZODone (DESYREL) 50 MG tablet Take 1 tablet (50 mg total) by mouth at bedtime as needed for sleep.   valACYclovir (VALTREX) 1000 MG tablet Take 1 tablet (1,000 mg total) by mouth 2 (two) times daily. For 5 days at onset of flare up.   No facility-administered encounter medications on file as of 04/01/2023.      Medical History: Past Medical History:  Diagnosis Date   Dysplastic nevus 07/28/2020   left mid back (moderate)   GERD (gastroesophageal reflux disease)    History of dysplastic nevus 09/01/2020   right shoulder, severe, excised Moh's   History of insomnia    Other viral warts    hands      Vital Signs: BP 110/72   Pulse 81   Temp 98.6 F (37 C)   Resp 16   Ht 5\' 4"  (1.626 m)   Wt 153 lb 9.6 oz (69.7 kg)   SpO2 99%   BMI 26.37 kg/m    Review of Systems  Constitutional: Negative.  Negative for fatigue.  HENT:         Cold sore on lower lip  Respiratory: Negative.  Negative for cough, chest tightness, shortness of breath and wheezing.   Cardiovascular: Negative.  Negative for chest pain and palpitations.  Gastrointestinal: Negative.   Genitourinary: Negative.     Physical Exam Vitals reviewed.  Constitutional:      General: She is not in acute distress.    Appearance: Normal appearance. She is not ill-appearing.  HENT:     Head: Normocephalic and atraumatic.     Mouth/Throat:     Lips: Lesions (lower lip cold sore) present.      Comments: Marked area is a cold sore Eyes:     Pupils: Pupils are equal, round, and reactive to light.  Cardiovascular:     Rate and Rhythm: Normal rate and regular rhythm.  Neurological:     Mental Status: She is alert and oriented to person, place, and time.  Psychiatric:  Mood and Affect: Mood normal.        Behavior: Behavior normal.       Assessment/Plan: 1. Recurrent cold sores Take valacyclovir as needed whenever having an outbreak as prescribed.  - valACYclovir (VALTREX) 1000 MG tablet; Take 1 tablet (1,000 mg total) by mouth 2 (two) times daily. For 5 days at onset of flare up.  Dispense: 60 tablet; Refill: 2   General Counseling: Jane Nichols verbalizes understanding of the findings of todays visit and agrees with plan of treatment. I have discussed any further diagnostic evaluation that may be needed or ordered today. We also reviewed her medications today. she has been encouraged to call the office with any questions or concerns that should arise related to todays visit.    Counseling:    No orders of the defined types were placed in this encounter.   Meds ordered this encounter  Medications    valACYclovir (VALTREX) 1000 MG tablet    Sig: Take 1 tablet (1,000 mg total) by mouth 2 (two) times daily. For 5 days at onset of flare up.    Dispense:  60 tablet    Refill:  2    Return if symptoms worsen or fail to improve.  [place] Controlled Substance Database was reviewed by me for overdose risk score (ORS)  Time spent:30 Minutes Time spent with patient included reviewing progress notes, labs, imaging studies, and discussing plan for follow up.   This patient was seen by Sallyanne Kuster, FNP-C in collaboration with Dr. Beverely Risen as a part of collaborative care agreement.  Berdena Cisek R. Tedd Sias, MSN, FNP-C Internal Medicine

## 2023-05-10 ENCOUNTER — Ambulatory Visit (INDEPENDENT_AMBULATORY_CARE_PROVIDER_SITE_OTHER): Admitting: Nurse Practitioner

## 2023-05-10 ENCOUNTER — Encounter: Payer: Self-pay | Admitting: Nurse Practitioner

## 2023-05-10 VITALS — BP 118/78 | HR 78 | Temp 98.4°F | Resp 16 | Ht 64.0 in | Wt 155.4 lb

## 2023-05-10 DIAGNOSIS — Z3202 Encounter for pregnancy test, result negative: Secondary | ICD-10-CM | POA: Diagnosis not present

## 2023-05-10 DIAGNOSIS — K644 Residual hemorrhoidal skin tags: Secondary | ICD-10-CM

## 2023-05-10 LAB — POCT URINE PREGNANCY: Preg Test, Ur: NEGATIVE

## 2023-05-10 NOTE — Progress Notes (Signed)
[facility] [address] [place], [place] [address]  Internal MEDICINE  Office Visit Note  Patient Name: Jane Nichols  [id number]  [id number]  Date of Service: 05/10/2023  Chief Complaint  Patient presents with   Acute Visit    Hemorrhoids.      HPI Thy presents for an acute sick visit for hemorrhoid.  Has external hemorrhoid that has been bothering her, wants to get removed, requesting general surgery consult.    Current Medication:  Outpatient Encounter Medications as of 05/10/2023  Medication Sig   acetaminophen-codeine (TYLENOL #3) 300-30 MG tablet Take one tab po qhs for pain   cyclobenzaprine (FLEXERIL) 10 MG tablet Take 1 tablet (10 mg total) by mouth 3 (three) times daily.   doxycycline (VIBRA-TABS) 100 MG tablet Take 1 tablet (100 mg total) by mouth 2 (two) times daily.   ondansetron (ZOFRAN) 4 MG tablet Take 1 tablet (4 mg total) by mouth every 6 (six) hours.   SUMAtriptan (IMITREX) 50 MG tablet Take one tab at the onset of headaches and may repeat in one in 2 hrs   topiramate (TOPAMAX) 25 MG tablet Take one tab a t night for prevention of migraine headaches   traMADol (ULTRAM) 50 MG tablet Take one tab po qhs for headaches   traZODone (DESYREL) 50 MG tablet Take 1 tablet (50 mg total) by mouth at bedtime as needed for sleep.   valACYclovir (VALTREX) 1000 MG tablet Take 1 tablet (1,000 mg total) by mouth 2 (two) times daily. For 5 days at onset of flare up.   No facility-administered encounter medications on file as of 05/10/2023.      Medical History: Past Medical History:  Diagnosis Date   Dysplastic nevus 07/28/2020   left mid back (moderate)   GERD (gastroesophageal reflux disease)    History of dysplastic nevus 09/01/2020   right shoulder, severe, excised Moh's   History of insomnia    Other viral warts    hands     Vital Signs: BP 118/78   Pulse 78   Temp 98.4 F (36.9 C)   Resp 16   Ht 5\' 4"  (1.626 m)   Wt 155 lb 6.4 oz  (70.5 kg)   SpO2 98%   BMI 26.67 kg/m    Review of Systems  Constitutional: Negative.   Respiratory: Negative.  Negative for cough, chest tightness, shortness of breath and wheezing.   Cardiovascular: Negative.  Negative for chest pain and palpitations.  Gastrointestinal:  Negative for constipation, diarrhea, nausea and vomiting.       External hemorrhoid  Genitourinary: Negative.   Musculoskeletal: Negative.     Physical Exam Vitals reviewed.  Constitutional:      Appearance: Normal appearance.  HENT:     Head: Normocephalic and atraumatic.  Eyes:     Pupils: Pupils are equal, round, and reactive to light.  Cardiovascular:     Rate and Rhythm: Normal rate and regular rhythm.  Pulmonary:     Effort: Pulmonary effort is normal. No respiratory distress.  Neurological:     Mental Status: She is alert and oriented to person, place, and time.  Psychiatric:        Mood and Affect: Mood normal.        Behavior: Behavior normal.       Assessment/Plan: 1. External hemorrhoid, bleeding Referred to general surgery - Ambulatory referral to General Surgery  2. Pregnancy examination or test, negative result Negative urine pregnancy test  - POCT urine pregnancy  General Counseling: Molleigh verbalizes understanding of the findings of todays visit and agrees with plan of treatment. I have discussed any further diagnostic evaluation that may be needed or ordered today. We also reviewed her medications today. she has been encouraged to call the office with any questions or concerns that should arise related to todays visit.    Counseling:    Orders Placed This Encounter  Procedures   Ambulatory referral to General Surgery   POCT urine pregnancy    No orders of the defined types were placed in this encounter.   Return if symptoms worsen or fail to improve.  [place] Controlled Substance Database was reviewed by me for overdose risk score (ORS)  Time spent:20 Minutes Time  spent with patient included reviewing progress notes, labs, imaging studies, and discussing plan for follow up.   This patient was seen by Sallyanne Kuster, FNP-C in collaboration with Dr. Beverely Risen as a part of collaborative care agreement.  Klynn Linnemann R. Tedd Sias, MSN, FNP-C Internal Medicine

## 2023-05-17 ENCOUNTER — Ambulatory Visit: Admitting: Surgery

## 2023-05-17 ENCOUNTER — Encounter: Payer: Self-pay | Admitting: Surgery

## 2023-05-17 VITALS — BP 127/83 | HR 77 | Temp 98.1°F | Ht 64.0 in | Wt 155.6 lb

## 2023-05-17 DIAGNOSIS — K602 Anal fissure, unspecified: Secondary | ICD-10-CM

## 2023-05-17 DIAGNOSIS — K644 Residual hemorrhoidal skin tags: Secondary | ICD-10-CM

## 2023-05-17 NOTE — Progress Notes (Signed)
05/17/2023  Reason for Visit:  External hemorrhoid  Requesting Provider:  Sallyanne Kuster, FNP  History of Present Illness: Jane Nichols is a 32 y.o. female presenting for evaluation of external hemorrhoids.  The patient reports she's had issues with hemorrhoids over the past year.  She does feel some enlarged tissue on the outside of the anal opening, and does feel tenderness.  She reports the main pain is with bowel movements.  She does have a history of constipation that is chronic and sometimes the stool is very hard/dry.  She has been recently taking fiber supplement and this has resulted in improved stool consistency.  She has also noticed bleeding with bowel movements.  She has tried Preparation H and also witch hazel.  She has also tried [employer] which have helped.  She's hoping not to have any surgery if possible.  Past Medical History: Past Medical History:  Diagnosis Date   Dysplastic nevus 07/28/2020   left mid back (moderate)   GERD (gastroesophageal reflux disease)    History of dysplastic nevus 09/01/2020   right shoulder, severe, excised Moh's   History of insomnia    Other viral warts    hands     Past Surgical History: Past Surgical History:  Procedure Laterality Date   FOOT SURGERY     TOE SURGERY Right 2006   bone spur   WISDOM TOOTH EXTRACTION  2012    Home Medications: Prior to Admission medications   Medication Sig Start Date End Date Taking? Authorizing Provider  valACYclovir (VALTREX) 1000 MG tablet Take 1 tablet (1,000 mg total) by mouth 2 (two) times daily. For 5 days at onset of flare up. 04/01/23  Yes Abernathy, Arlyss Repress, NP    Allergies: No Known Allergies  Social History:  reports that she has quit smoking. Her smoking use included cigarettes. She has a 2 pack-year smoking history. She has never used smokeless tobacco. She reports that she does not currently use alcohol after a past usage of about 1.0 standard drink of alcohol per week. She  reports that she does not use drugs.   Family History: Family History  Problem Relation Age of Onset   Kidney disease Mother    Bipolar disorder Mother    Diabetes Paternal Grandfather     Review of Systems: Review of Systems  Constitutional:  Negative for chills and fever.  Respiratory:  Negative for shortness of breath.   Cardiovascular:  Negative for chest pain.  Gastrointestinal:  Positive for blood in stool and constipation. Negative for nausea and vomiting.    Physical Exam BP 127/83   Pulse 77   Temp 98.1 F (36.7 C) (Oral)   Ht 5\' 4"  (1.626 m)   Wt 155 lb 9.6 oz (70.6 kg)   SpO2 99%   BMI 26.71 kg/m  CONSTITUTIONAL: No acute distress, well nourished. HEENT:  Normocephalic, atraumatic, extraocular motion intact.  RESPIRATORY:  Normal respiratory effort without pathologic use of accessory muscles. CARDIOVASCULAR: Regular rhythm and rate RECTAL:  External exam reveals mildly enlarged right anterior and posterior external hemorrhoids, but without any inflammatory changes to suggest a current flare-up.  On palpation, she has tenderness posteriorly, and on further look, she has a small anal fissure posteriorly.  DRE deferred today. MUSCULOSKELETAL:  Normal muscle strength and tone in all four extremities.  No peripheral edema or cyanosis. PSYCH:  Alert and oriented to person, place and time. Affect is normal.  Laboratory Analysis: No results found for this or any previous visit (  from the past 24 hour(s)).  Imaging: No results found.  Assessment and Plan: This is a 32 y.o. female with anal fissure  --Discussed with the patient the findings on exam showing an anal fissure posteriorly.  Discussed with her how fissures may form, and how constipation is the primary risk factor for these.  Discussed how the cycle of inflammation, spasm, and non-healing can continue, and the focus of treatment is to relax the sphincter muscle to allow improved blood flow to the tissue and the  fissure to heal.  Also discussed the need to improve her constipation.  She can continue her fiber supplements and also recommended drinking more fluids and also adding miralax.  She can continue doing Sitz baths.  Will also prescribe nifedipine ointment to help with the sphincter muscle. --Patient will follow up with me in about a month.  All of her questions have been answered.  I spent 30 minutes dedicated to the care of this patient on the date of this encounter to include pre-visit review of records, face-to-face time with the patient discussing diagnosis and management, and any post-visit coordination of care.   Howie Ill, MD [facility]

## 2023-05-17 NOTE — Patient Instructions (Addendum)
Please pick up your prescription Nifedipine at [facility] in [place].   [address], [place], [place] [address]  [phone]  If you have any concerns or questions, please feel free to call our office. See follow up appointment.   Anal Fissure, Adult   An anal fissure is a small tear or crack in the tissue near the opening of the butt (anus). In most cases, bleeding from the tear or crack stops on its own within a few minutes. You may have bleeding each time you poop until the tear or crack heals. What are the causes? Passing a large or hard poop (stool). Having trouble pooping (constipation). Having watery poops (diarrhea). An inflammatory bowel disease, like Crohn's disease or ulcerative colitis. Childbirth. Infections. Anal sex. What are the signs or symptoms? Bleeding from the butt. Small amounts of blood on your poop. The blood coats the outside of the poop. It is not mixed with the poop. Small amounts of blood on the toilet paper or in the toilet after you poop. Pain when you poop. Itching or irritation around your butt. How is this treated? Treatment may include: Making changes to what you eat and drink. This can help if you have trouble pooping. Taking fiber supplements. These can help make your poop soft. Taking warm water baths (sitz baths). These can help heal the tear. Using creams and ointments. If other treatments do not work, you may need: A shot near the tear or crack (botulinum injection). Surgery to fix the tear or crack. Follow these instructions at home: Medicines Take over-the-counter and prescription medicines only as told by your doctor. This includes creams and ointments that have medicine in them. Use medicines to make your poop soft as told by your doctor. Treating constipation You may need to take these actions to prevent or treat trouble pooping: Drink enough fluid to keep your pee (urine) pale yellow. Eat foods that are high in fiber. These  include beans, whole grains, and fresh fruits and vegetables. Stay away from unripe bananas. Ripe bananas are a good choice. Limit foods that are high in fat and sugar. These include fried or sweet foods. Avoid dairy products. This includes milk.  General instructions  Keep the butt area clean and dry. Take a warm water bath as told by your doctor. Do not use soap. Contact a doctor if: You have more bleeding. You have a fever. You have watery poop that is mixed with blood. Your pain does not go away. Your problems get worse. This information is not intended to replace advice given to you by your health care provider. Make sure you discuss any questions you have with your health care provider. Document Revised: 09/20/2022 Document Reviewed: 09/20/2022 Elsevier Patient Education  2024 [employer].

## 2023-06-17 ENCOUNTER — Ambulatory Visit: Admitting: Surgery
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
